# Patient Record
Sex: Female | Born: 1990
Health system: Southern US, Community
[De-identification: ages and names within clinical notes are randomized; demographics above are authoritative.]

## PROBLEM LIST (undated history)

## (undated) DIAGNOSIS — D649 Anemia, unspecified: Secondary | ICD-10-CM

## (undated) DIAGNOSIS — F419 Anxiety disorder, unspecified: Secondary | ICD-10-CM

## (undated) DIAGNOSIS — F32A Depression, unspecified: Secondary | ICD-10-CM

## (undated) DIAGNOSIS — Z8041 Family history of malignant neoplasm of ovary: Secondary | ICD-10-CM

## (undated) DIAGNOSIS — Z803 Family history of malignant neoplasm of breast: Secondary | ICD-10-CM

## (undated) HISTORY — DX: Family history of malignant neoplasm of breast: Z80.3

## (undated) HISTORY — DX: Family history of malignant neoplasm of ovary: Z80.41

---

## 2014-05-11 ENCOUNTER — Emergency Department (HOSPITAL_COMMUNITY)
Admission: EM | Admit: 2014-05-11 | Discharge: 2014-05-11 | Disposition: A | Discharge status: Home / Self Care | Payer: Private Health Insurance - Indemnity | Attending: Emergency Medicine | Admitting: Emergency Medicine

## 2014-05-11 ENCOUNTER — Encounter (HOSPITAL_COMMUNITY): Payer: Self-pay | Admitting: Emergency Medicine

## 2014-05-11 DIAGNOSIS — S0510XA Contusion of eyeball and orbital tissues, unspecified eye, initial encounter: Secondary | ICD-10-CM | POA: Insufficient documentation

## 2014-05-11 DIAGNOSIS — IMO0002 Reserved for concepts with insufficient information to code with codable children: Secondary | ICD-10-CM

## 2014-05-11 DIAGNOSIS — F3289 Other specified depressive episodes: Secondary | ICD-10-CM | POA: Insufficient documentation

## 2014-05-11 DIAGNOSIS — F329 Major depressive disorder, single episode, unspecified: Secondary | ICD-10-CM | POA: Insufficient documentation

## 2014-05-11 DIAGNOSIS — T07XXXA Unspecified multiple injuries, initial encounter: Secondary | ICD-10-CM

## 2014-05-11 DIAGNOSIS — S40019A Contusion of unspecified shoulder, initial encounter: Secondary | ICD-10-CM | POA: Insufficient documentation

## 2014-05-11 DIAGNOSIS — S0012XA Contusion of left eyelid and periocular area, initial encounter: Secondary | ICD-10-CM

## 2014-05-11 DIAGNOSIS — T71163A Asphyxiation due to hanging, assault, initial encounter: Secondary | ICD-10-CM | POA: Insufficient documentation

## 2014-05-11 MED ORDER — TRAMADOL HCL 50 MG PO TABS
50.0000 mg | ORAL_TABLET | Freq: Four times a day (QID) | ORAL | Status: DC | PRN
Start: 2014-05-11 — End: 2018-01-27

## 2014-05-11 MED ORDER — IBUPROFEN 800 MG PO TABS
800.0000 mg | ORAL_TABLET | Freq: Once | ORAL | Status: AC
  Administered 2014-05-11: 800 mg via ORAL
  Filled 2014-05-11: qty 1

## 2014-05-11 NOTE — Discharge Instructions (Signed)
For pain control you may take:  800mg of ibuprofen (that is usually 4 over the counter pills)  3 times a day (take with food) and acetaminophen 975mg (this is 3 over the counter pills) four times a day. Do not drink alcohol or combine with other medications that have acetaminophen as an ingredient (Read the labels!).  For breakthrough pain you may take Tramadol. Do not drink alcohol drive or operate heavy machinery when taking Tramadol. ° °Do not hesitate to return to the Emergency Department for any new, worsening or concerning symptoms.  ° °If you do not have a primary care doctor you can establish one at the  ° °[facility]: °[address] °[place] [address] °[phone] ° °After you establish care. Let them know you were seen in the emergency room. They must obtain records for further management.  ° ° °

## 2014-05-11 NOTE — ED Provider Notes (Signed)
Medical screening examination/treatment/procedure(s) were performed by non-physician practitioner and as supervising physician I was immediately available for consultation/collaboration.   EKG Interpretation None      Devoria AlbeIva Knapp, MD, Armando GangFACEP   Ward GivensIva L Knapp, MD 05/11/14 1524
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
## 2015-02-18 ENCOUNTER — Encounter: Admit: 2015-02-18 | Payer: Self-pay | Admitting: Obstetrics and Gynecology

## 2015-02-25 ENCOUNTER — Encounter
Admit: 2015-02-25 | Disposition: A | Discharge status: Home / Self Care | Payer: Self-pay | Attending: Maternal & Fetal Medicine | Admitting: Maternal & Fetal Medicine

## 2015-03-01 ENCOUNTER — Other Ambulatory Visit: Payer: Self-pay | Admitting: Maternal & Fetal Medicine

## 2015-03-01 DIAGNOSIS — Z148 Genetic carrier of other disease: Secondary | ICD-10-CM

## 2015-03-25 ENCOUNTER — Ambulatory Visit
Admission: RE | Admit: 2015-03-25 | Discharge: 2015-03-25 | Disposition: A | Discharge status: Home / Self Care | Payer: 59 | Source: Ambulatory Visit | Attending: Obstetrics & Gynecology | Admitting: Obstetrics & Gynecology

## 2015-03-25 VITALS — BP 102/56 | HR 74 | Temp 98.6°F | Ht 62.0 in | Wt 142.0 lb

## 2015-03-25 DIAGNOSIS — Z148 Genetic carrier of other disease: Secondary | ICD-10-CM | POA: Diagnosis present

## 2015-03-25 DIAGNOSIS — Z3A18 18 weeks gestation of pregnancy: Secondary | ICD-10-CM | POA: Insufficient documentation

## 2015-03-25 DIAGNOSIS — Z8481 Family history of carrier of genetic disease: Secondary | ICD-10-CM | POA: Insufficient documentation

## 2015-03-25 DIAGNOSIS — O26892 Other specified pregnancy related conditions, second trimester: Secondary | ICD-10-CM | POA: Diagnosis not present

## 2015-03-25 LAB — US OB DETAIL + 14 WK

## 2015-06-03 ENCOUNTER — Inpatient Hospital Stay: Admission: RE | Admit: 2015-06-03 | Payer: 59 | Source: Ambulatory Visit

## 2015-06-21 ENCOUNTER — Other Ambulatory Visit: Payer: Self-pay | Admitting: Nurse Practitioner

## 2015-08-15 LAB — OB RESULTS CONSOLE GBS: GBS: NEGATIVE

## 2015-08-28 ENCOUNTER — Inpatient Hospital Stay
Admission: EM | Admit: 2015-08-28 | Discharge: 2015-09-01 | DRG: 765 | Disposition: A | Discharge status: Home / Self Care | Payer: 59 | Attending: Obstetrics & Gynecology | Admitting: Obstetrics & Gynecology

## 2015-08-28 DIAGNOSIS — Z3490 Encounter for supervision of normal pregnancy, unspecified, unspecified trimester: Secondary | ICD-10-CM

## 2015-08-28 DIAGNOSIS — O480 Post-term pregnancy: Secondary | ICD-10-CM | POA: Diagnosis present

## 2015-08-28 DIAGNOSIS — O339 Maternal care for disproportion, unspecified: Principal | ICD-10-CM | POA: Diagnosis present

## 2015-08-28 DIAGNOSIS — Z8481 Family history of carrier of genetic disease: Secondary | ICD-10-CM

## 2015-08-28 DIAGNOSIS — O9081 Anemia of the puerperium: Secondary | ICD-10-CM

## 2015-08-28 DIAGNOSIS — Z3A41 41 weeks gestation of pregnancy: Secondary | ICD-10-CM

## 2015-08-28 DIAGNOSIS — Z98891 History of uterine scar from previous surgery: Secondary | ICD-10-CM

## 2015-08-28 DIAGNOSIS — D62 Acute posthemorrhagic anemia: Secondary | ICD-10-CM | POA: Diagnosis present

## 2015-08-28 HISTORY — DX: Anemia, unspecified: D64.9

## 2015-08-28 LAB — CBC
HEMATOCRIT: 27.7 % — AB (ref 35.0–47.0)
Hemoglobin: 8.9 g/dL — ABNORMAL LOW (ref 12.0–16.0)
MCH: 25.3 pg — AB (ref 26.0–34.0)
MCHC: 32.2 g/dL (ref 32.0–36.0)
MCV: 78.6 fL — ABNORMAL LOW (ref 80.0–100.0)
Platelets: 195 10*3/uL (ref 150–440)
RBC: 3.53 MIL/uL — ABNORMAL LOW (ref 3.80–5.20)
RDW: 15.9 % — AB (ref 11.5–14.5)
WBC: 9.8 10*3/uL (ref 3.6–11.0)

## 2015-08-28 MED ORDER — LACTATED RINGERS IV SOLN
500.0000 mL | INTRAVENOUS | Status: DC | PRN
  Administered 2015-08-29: 250 mL via INTRAVENOUS

## 2015-08-28 MED ORDER — OXYCODONE-ACETAMINOPHEN 5-325 MG PO TABS: 2.0000 | ORAL_TABLET | ORAL | Status: DC | PRN

## 2015-08-28 MED ORDER — OXYCODONE-ACETAMINOPHEN 5-325 MG PO TABS: 1.0000 | ORAL_TABLET | ORAL | Status: DC | PRN

## 2015-08-28 MED ORDER — ZOLPIDEM TARTRATE 5 MG PO TABS
5.0000 mg | ORAL_TABLET | Freq: Every evening | ORAL | Status: DC | PRN
  Administered 2015-08-29: 5 mg via ORAL
  Filled 2015-08-28: qty 1

## 2015-08-28 MED ORDER — ACETAMINOPHEN 325 MG PO TABS: 650.0000 mg | ORAL_TABLET | ORAL | Status: DC | PRN

## 2015-08-28 MED ORDER — OXYTOCIN 40 UNITS IN LACTATED RINGERS INFUSION - SIMPLE MED
62.5000 mL/h | INTRAVENOUS | Status: DC
Start: 2015-08-28 — End: 2015-08-30
  Filled 2015-08-28: qty 1000

## 2015-08-28 MED ORDER — OXYTOCIN BOLUS FROM INFUSION: 500.0000 mL | INTRAVENOUS | Status: DC

## 2015-08-28 MED ORDER — LACTATED RINGERS IV SOLN
INTRAVENOUS | Status: DC
  Administered 2015-08-29 – 2015-08-30 (×3): via INTRAVENOUS

## 2015-08-28 MED ORDER — ONDANSETRON HCL 4 MG/2ML IJ SOLN
4.0000 mg | Freq: Four times a day (QID) | INTRAMUSCULAR | Status: DC | PRN
  Administered 2015-08-29 – 2015-08-30 (×2): 4 mg via INTRAVENOUS
  Filled 2015-08-28: qty 2

## 2015-08-28 MED ORDER — DINOPROSTONE 10 MG VA INST
10.0000 mg | VAGINAL_INSERT | Freq: Once | VAGINAL | Status: AC
  Administered 2015-08-28: 10 mg via VAGINAL
  Filled 2015-08-28: qty 1

## 2015-08-28 MED ORDER — CITRIC ACID-SODIUM CITRATE 334-500 MG/5ML PO SOLN: 30.0000 mL | ORAL | Status: DC | PRN

## 2015-08-28 MED ORDER — LIDOCAINE HCL (PF) 1 % IJ SOLN
30.0000 mL | INTRAMUSCULAR | Status: AC | PRN
  Administered 2015-08-29: 3 mL via SUBCUTANEOUS

## 2015-08-28 NOTE — H&P (Signed)
Date of Initial paper H&P: 08/26/15  History reviewed, patient examined, no change in status, stable for elective IOL at 5541w3d.

## 2015-08-29 ENCOUNTER — Inpatient Hospital Stay: Payer: 59 | Admitting: Anesthesiology

## 2015-08-29 LAB — OB RESULTS CONSOLE GC/CHLAMYDIA
Chlamydia: NEGATIVE
Gonorrhea: NEGATIVE

## 2015-08-29 LAB — CHLAMYDIA/NGC RT PCR (ARMC ONLY)
CHLAMYDIA TR: NOT DETECTED
N GONORRHOEAE: NOT DETECTED

## 2015-08-29 LAB — ABO/RH: ABO/RH(D): A NEG

## 2015-08-29 MED ORDER — FENTANYL 2.5 MCG/ML W/ROPIVACAINE 0.2% IN NS 100 ML EPIDURAL INFUSION (ARMC-ANES)
9.0000 mL/h | EPIDURAL | Status: DC
  Administered 2015-08-29 – 2015-08-30 (×2): 9 mL/h via EPIDURAL
  Filled 2015-08-29: qty 100

## 2015-08-29 MED ORDER — BUPIVACAINE HCL (PF) 0.25 % IJ SOLN
INTRAMUSCULAR | Status: DC | PRN
  Administered 2015-08-29: 5 mL via EPIDURAL
  Administered 2015-08-29: 3 mL via EPIDURAL

## 2015-08-29 MED ORDER — BUTORPHANOL TARTRATE 1 MG/ML IJ SOLN
INTRAMUSCULAR | Status: AC
  Administered 2015-08-29: 1 mg via INTRAVENOUS
  Filled 2015-08-29: qty 1

## 2015-08-29 MED ORDER — EPHEDRINE 5 MG/ML INJ: 10.0000 mg | INTRAVENOUS | Status: DC | PRN

## 2015-08-29 MED ORDER — TERBUTALINE SULFATE 1 MG/ML IJ SOLN: 0.2500 mg | Freq: Once | INTRAMUSCULAR | Status: DC | PRN

## 2015-08-29 MED ORDER — FENTANYL 2.5 MCG/ML W/ROPIVACAINE 0.2% IN NS 100 ML EPIDURAL INFUSION (ARMC-ANES)
EPIDURAL | Status: AC
Start: 2015-08-29 — End: 2015-08-30
  Administered 2015-08-29 – 2015-08-30 (×2): 9 mL/h via EPIDURAL
  Filled 2015-08-29: qty 100

## 2015-08-29 MED ORDER — SODIUM CHLORIDE 0.9 % IJ SOLN
INTRAMUSCULAR | Status: AC
  Administered 2015-08-29: 3 mL
  Filled 2015-08-29: qty 3

## 2015-08-29 MED ORDER — DIPHENHYDRAMINE HCL 50 MG/ML IJ SOLN: 12.5000 mg | INTRAMUSCULAR | Status: DC | PRN

## 2015-08-29 MED ORDER — OXYTOCIN 40 UNITS IN LACTATED RINGERS INFUSION - SIMPLE MED
1.0000 m[IU]/min | INTRAVENOUS | Status: DC
  Administered 2015-08-29: 4 m[IU]/min via INTRAVENOUS
  Administered 2015-08-29: 1 m[IU]/min via INTRAVENOUS

## 2015-08-29 MED ORDER — BUTORPHANOL TARTRATE 1 MG/ML IJ SOLN
1.0000 mg | INTRAMUSCULAR | Status: DC | PRN
  Administered 2015-08-29 (×3): 1 mg via INTRAVENOUS
  Filled 2015-08-29 (×2): qty 1

## 2015-08-29 MED ORDER — PHENYLEPHRINE 40 MCG/ML (10ML) SYRINGE FOR IV PUSH (FOR BLOOD PRESSURE SUPPORT): 80.0000 ug | PREFILLED_SYRINGE | INTRAVENOUS | Status: DC | PRN

## 2015-08-29 MED ORDER — LIDOCAINE-EPINEPHRINE (PF) 1.5 %-1:200000 IJ SOLN
INTRAMUSCULAR | Status: DC | PRN
  Administered 2015-08-29: 3 mL via PERINEURAL

## 2015-08-29 NOTE — Progress Notes (Signed)
  Labor Progress Note   24 y.o. G1P0 @ 2238w6d , admitted for  Pregnancy, Labor Management. IOL POST DATES.  Subjective:  Pain improved w epidural, on Pitocin 7 mU/min.   Objective:  BP 95/61 mmHg  Pulse 69  Temp(Src) 98.3 F (36.8 C) (Oral)  Resp 18  Ht 5\' 2"  (1.575 m)  Wt 170 lb (77.111 kg)  BMI 31.09 kg/m2  SpO2 100%  LMP 11/16/2014 (Exact Date) Abd: Vtx, tender w Ctxs Extr: trace to 1+ bilateral pedal edema SVE: CERVIX: 5 cm dilated, 80 effaced, -2 station, presenting part Vtx MEMBRANES: AROM clear  EFM: FHR: 150 bpm, variability: moderate,  accelerations:  Present,  decelerations:  Absent Toco: Frequency: Every 3-4 minutes Labs: I have reviewed the patient's lab results.   Assessment & Plan:  G1P0 @ 2738w6d, admitted for  Pregnancy and Labor/Delivery Management  1. Pain management: none. Epidural 2. FWB: FHT category 1.  3. ID: GBS negative 4. Labor management: Pitocin.  AROM. IUPC  All discussed with patient, see orders

## 2015-08-29 NOTE — Anesthesia Preprocedure Evaluation (Signed)
Anesthesia Evaluation  Patient identified by MRN, date of birth, ID band  Reviewed: Allergy & Precautions, H&P , Patient's Chart, lab work & pertinent test results  History of Anesthesia Complications Negative for: history of anesthetic complications  Airway Mallampati: II  TM Distance: <3 FB Neck ROM: full    Dental no notable dental hx. (+) Teeth Intact   Pulmonary neg pulmonary ROS,    Pulmonary exam normal        Cardiovascular negative cardio ROS Normal cardiovascular exam     Neuro/Psych negative neurological ROS  negative psych ROS   GI/Hepatic negative GI ROS, Neg liver ROS,   Endo/Other  negative endocrine ROS  Renal/GU negative Renal ROS  negative genitourinary   Musculoskeletal   Abdominal   Peds  Hematology negative hematology ROS (+)   Anesthesia Other Findings   Reproductive/Obstetrics (+) Pregnancy                             Anesthesia Physical Anesthesia Plan  ASA: II  Anesthesia Plan: Epidural   Post-op Pain Management:    Induction:   Airway Management Planned:   Additional Equipment:   Intra-op Plan:   Post-operative Plan:   Informed Consent: I have reviewed the patients History and Physical, chart, labs and discussed the procedure including the risks, benefits and alternatives for the proposed anesthesia with the patient or authorized representative who has indicated his/her understanding and acceptance.     Plan Discussed with: CRNA and Anesthesiologist  Anesthesia Plan Comments:         Anesthesia Quick Evaluation

## 2015-08-29 NOTE — Progress Notes (Signed)
  Labor Progress Note   [age] y.o. G1P0 @ 1965w6d , admitted for  Pregnancy, Labor Management. IOL POST DATES.  Subjective:  Pain mild and difficulty sleeping last night w Cervadil  Objective:  BP 122/75 mmHg  Pulse 103  Temp(Src) 98.6 F (37 C) (Oral)  Resp 18  Ht 5\' 2"  (1.575 m)  Wt 170 lb (77.111 kg)  BMI 31.09 kg/m2  LMP 11/16/2014 (Exact Date) Abd: Vtx, T 2 Ctxs Extr: trace to 1+ bilateral pedal edema SVE: CERVIX: 1 cm dilated, 80 effaced, -3 station, presenting part Vtx MEMBRANES: intact  EFM: FHR: 150 bpm, variability: moderate,  accelerations:  Present,  decelerations:  Absent Toco: Frequency: Every 3-4 minutes Labs: I have reviewed the patient's lab results.   Assessment & Plan:  G1P0 @ 4065w6d, admitted for  Pregnancy and Labor/Delivery Management  1. Pain management: none. 2. FWB: FHT category 1.  3. ID: GBS negative 4. Labor management: Pitocin next.  AROM when able.  Epidural when changes cervix.  All discussed with patient, see orders

## 2015-08-29 NOTE — Progress Notes (Signed)
  Labor Progress Note   24 y.o. G1P0 @ 2557w6d , admitted for  Pregnancy, Labor Management. IOL POST DATES.  Subjective:  Pain tolerated w epidural, on Pitocin 20 mU/min.   Objective:  BP 105/53 mmHg  Pulse 79  Temp(Src) 98.3 F (36.8 C) (Oral)  Resp 16  Ht 5\' 2"  (1.575 m)  Wt 170 lb (77.111 kg)  BMI 31.09 kg/m2  SpO2 100%  LMP 11/16/2014 (Exact Date) Abd: Vtx, tender w Ctxs Extr: trace to 1+ bilateral pedal edema SVE: CERVIX: 7 cm dilated, 80 effaced, -2 station, presenting part Vtx w caput MEMBRANES: ruptured  EFM: FHR: 150 bpm, variability: moderate,  accelerations:  Present,  decelerations:  Absent Toco: Frequency: Every 2-3 minutes, >180 mVu Labs: I have reviewed the patient's lab results.   Assessment & Plan:  G1P0 @ 6657w6d, admitted for  Pregnancy and Labor/Delivery Management  1. Pain management:  Epidural 2. FWB: FHT category 1.  3. ID: GBS negative 4. Labor management: Pitocin monitored w IUPC Protracted labor risks d/w pt (pt was 5 cm for several hours before progressing to 7).  Cont to monitor.  CS discussed.  All discussed with patient, see orders

## 2015-08-29 NOTE — Progress Notes (Signed)
  Labor Progress Note   24 y.o. G1P0 @ 6817w6d , admitted for  Pregnancy, Labor Management. IOL POST DATES.  Subjective:  Pain mod, on Pitocin 7 mU/min.  Leaking fluid but neg for amnioitic fluid  Objective:  BP 111/57 mmHg  Pulse 86  Temp(Src) 98.3 F (36.8 C) (Oral)  Resp 18  Ht 5\' 2"  (1.575 m)  Wt 170 lb (77.111 kg)  BMI 31.09 kg/m2  LMP 11/16/2014 (Exact Date) Abd: Vtx, tender w Ctxs Extr: trace to 1+ bilateral pedal edema SVE: CERVIX: 3 cm dilated, 80 effaced, -2 station, presenting part Vtx MEMBRANES: intact  EFM: FHR: 150 bpm, variability: moderate,  accelerations:  Present,  decelerations:  Absent Toco: Frequency: Every 3-4 minutes Labs: I have reviewed the patient's lab results.   Assessment & Plan:  G1P0 @ 1017w6d, admitted for  Pregnancy and Labor/Delivery Management  1. Pain management: none. Epidural now. 2. FWB: FHT category 1.  3. ID: GBS negative 4. Labor management: Pitocin.  AROM when able.  Epidural when changes cervix.  All discussed with patient, see orders

## 2015-08-29 NOTE — Progress Notes (Signed)
Pt has been sleeping.  States now she is starting to feel some of the contractions.  No real pain at this time.  EFM sketchy at times d/t audible fetal movement.

## 2015-08-29 NOTE — Anesthesia Procedure Notes (Signed)
Epidural Patient location during procedure: OB Start time: 08/29/2015 3:30 PM  Staffing Resident/CRNA: Greogory Cornette Performed by: resident/CRNA   Preanesthetic Checklist Completed: patient identified, site marked, surgical consent, pre-op evaluation, timeout performed, IV checked, risks and benefits discussed and monitors and equipment checked  Epidural Patient position: sitting Prep: Betadine Patient monitoring: heart rate, continuous pulse ox and blood pressure Approach: midline Location: L4-L5 Injection technique: LOR saline  Needle:  Needle type: Tuohy  Needle gauge: 18 G Needle length: 9 cm and 9 Catheter type: closed end flexible Catheter size: 20 Guage Test dose: negative and 1.5% lidocaine with Epi 1:200 K  Assessment Events: blood not aspirated, injection not painful, no injection resistance, negative IV test and no paresthesia  Additional Notes   Patient tolerated the insertion well without complications.Reason for block:procedure for pain

## 2015-08-30 ENCOUNTER — Other Ambulatory Visit: Payer: Self-pay | Admitting: Obstetrics and Gynecology

## 2015-08-30 ENCOUNTER — Inpatient Hospital Stay: Payer: 59 | Admitting: Anesthesiology

## 2015-08-30 ENCOUNTER — Encounter
Admission: EM | Disposition: A | Discharge status: Home / Self Care | Payer: Self-pay | Source: Home / Self Care | Attending: Obstetrics & Gynecology

## 2015-08-30 DIAGNOSIS — Z98891 History of uterine scar from previous surgery: Secondary | ICD-10-CM

## 2015-08-30 LAB — CBC
HCT: 24.4 % — ABNORMAL LOW (ref 35.0–47.0)
Hemoglobin: 7.8 g/dL — ABNORMAL LOW (ref 12.0–16.0)
MCH: 25.4 pg — ABNORMAL LOW (ref 26.0–34.0)
MCHC: 31.9 g/dL — ABNORMAL LOW (ref 32.0–36.0)
MCV: 79.5 fL — ABNORMAL LOW (ref 80.0–100.0)
Platelets: 154 K/uL (ref 150–440)
RBC: 3.07 MIL/uL — ABNORMAL LOW (ref 3.80–5.20)
RDW: 15.8 % — ABNORMAL HIGH (ref 11.5–14.5)
WBC: 15.8 K/uL — ABNORMAL HIGH (ref 3.6–11.0)

## 2015-08-30 LAB — RPR: RPR Ser Ql: NONREACTIVE

## 2015-08-30 SURGERY — Surgical Case
Anesthesia: Spinal | Site: Abdomen | Wound class: Clean Contaminated

## 2015-08-30 MED ORDER — MORPHINE SULFATE (PF) 2 MG/ML IV SOLN
1.0000 mg | INTRAVENOUS | Status: DC | PRN
Start: 2015-08-30 — End: 2015-08-31

## 2015-08-30 MED ORDER — CITRIC ACID-SODIUM CITRATE 334-500 MG/5ML PO SOLN
30.0000 mL | ORAL | Status: AC
  Administered 2015-08-30: 30 mL via ORAL

## 2015-08-30 MED ORDER — OXYTOCIN 40 UNITS IN LACTATED RINGERS INFUSION - SIMPLE MED
62.5000 mL/h | INTRAVENOUS | Status: DC
  Administered 2015-08-30: 62.5 mL/h via INTRAVENOUS
  Filled 2015-08-30: qty 1000

## 2015-08-30 MED ORDER — KETOROLAC TROMETHAMINE 30 MG/ML IJ SOLN
30.0000 mg | Freq: Four times a day (QID) | INTRAMUSCULAR | Status: AC
  Administered 2015-08-30 (×4): 30 mg via INTRAVENOUS
  Filled 2015-08-30 (×4): qty 1

## 2015-08-30 MED ORDER — NALBUPHINE HCL 10 MG/ML IJ SOLN
5.0000 mg | INTRAMUSCULAR | Status: DC | PRN
  Filled 2015-08-30: qty 0.5

## 2015-08-30 MED ORDER — WITCH HAZEL-GLYCERIN EX PADS: 1.0000 "application " | MEDICATED_PAD | CUTANEOUS | Status: DC | PRN

## 2015-08-30 MED ORDER — FERROUS GLUCONATE 324 (38 FE) MG PO TABS
324.0000 mg | ORAL_TABLET | Freq: Every day | ORAL | Status: DC
  Administered 2015-08-30 – 2015-08-31 (×2): 324 mg via ORAL
  Filled 2015-08-30 (×2): qty 1

## 2015-08-30 MED ORDER — NALOXONE HCL 1 MG/ML IJ SOLN
1.0000 ug/kg/h | INTRAMUSCULAR | Status: DC | PRN
  Filled 2015-08-30: qty 2

## 2015-08-30 MED ORDER — DIPHENHYDRAMINE HCL 50 MG/ML IJ SOLN: 12.5000 mg | INTRAMUSCULAR | Status: DC | PRN

## 2015-08-30 MED ORDER — SIMETHICONE 80 MG PO CHEW
80.0000 mg | CHEWABLE_TABLET | Freq: Three times a day (TID) | ORAL | Status: DC
  Administered 2015-08-30: 80 mg via ORAL
  Filled 2015-08-30: qty 1

## 2015-08-30 MED ORDER — NALBUPHINE HCL 10 MG/ML IJ SOLN
5.0000 mg | Freq: Once | INTRAMUSCULAR | Status: DC | PRN
  Filled 2015-08-30: qty 0.5

## 2015-08-30 MED ORDER — BUPIVACAINE IN DEXTROSE 0.75-8.25 % IT SOLN
INTRATHECAL | Status: DC | PRN
  Administered 2015-08-30: 1.7 mL via INTRATHECAL

## 2015-08-30 MED ORDER — BUPIVACAINE HCL (PF) 0.5 % IJ SOLN
INTRAMUSCULAR | Status: AC
  Filled 2015-08-30: qty 30

## 2015-08-30 MED ORDER — OXYCODONE-ACETAMINOPHEN 5-325 MG PO TABS
1.0000 | ORAL_TABLET | ORAL | Status: DC | PRN
  Administered 2015-08-30 – 2015-08-31 (×5): 1 via ORAL
  Filled 2015-08-30 (×5): qty 1

## 2015-08-30 MED ORDER — NALOXONE HCL 0.4 MG/ML IJ SOLN: 0.4000 mg | INTRAMUSCULAR | Status: DC | PRN

## 2015-08-30 MED ORDER — OXYCODONE-ACETAMINOPHEN 5-325 MG PO TABS
2.0000 | ORAL_TABLET | ORAL | Status: DC | PRN
  Administered 2015-09-01: 2 via ORAL
  Filled 2015-08-30: qty 2

## 2015-08-30 MED ORDER — SODIUM CHLORIDE 0.9 % IJ SOLN: 3.0000 mL | INTRAMUSCULAR | Status: DC | PRN

## 2015-08-30 MED ORDER — LACTATED RINGERS IV SOLN: INTRAVENOUS | Status: DC

## 2015-08-30 MED ORDER — CEFOXITIN SODIUM-DEXTROSE 2-2.2 GM-% IV SOLR (PREMIX)
INTRAVENOUS | Status: AC
  Filled 2015-08-30: qty 50

## 2015-08-30 MED ORDER — BUPIVACAINE HCL (PF) 0.5 % IJ SOLN
INTRAMUSCULAR | Status: DC | PRN
  Administered 2015-08-30: 10 mL

## 2015-08-30 MED ORDER — MENTHOL 3 MG MT LOZG: 1.0000 | LOZENGE | OROMUCOSAL | Status: DC | PRN

## 2015-08-30 MED ORDER — OXYTOCIN 40 UNITS IN LACTATED RINGERS INFUSION - SIMPLE MED
INTRAVENOUS | Status: DC | PRN
  Administered 2015-08-30: 1000 mL via INTRAVENOUS

## 2015-08-30 MED ORDER — SENNOSIDES-DOCUSATE SODIUM 8.6-50 MG PO TABS
2.0000 | ORAL_TABLET | ORAL | Status: DC
  Administered 2015-08-31 – 2015-09-01 (×2): 2 via ORAL
  Filled 2015-08-30 (×2): qty 2

## 2015-08-30 MED ORDER — ONDANSETRON HCL 4 MG/2ML IJ SOLN: 4.0000 mg | Freq: Once | INTRAMUSCULAR | Status: DC | PRN

## 2015-08-30 MED ORDER — BUPIVACAINE 0.25 % ON-Q PUMP DUAL CATH 400 ML: 400.0000 mL | INJECTION | Status: DC

## 2015-08-30 MED ORDER — PHENYLEPHRINE HCL 10 MG/ML IJ SOLN: 10.0000 mg | INTRAMUSCULAR | Status: DC | PRN

## 2015-08-30 MED ORDER — DIPHENHYDRAMINE HCL 25 MG PO CAPS: 25.0000 mg | ORAL_CAPSULE | ORAL | Status: DC | PRN

## 2015-08-30 MED ORDER — DIPHENHYDRAMINE HCL 25 MG PO CAPS: 25.0000 mg | ORAL_CAPSULE | Freq: Four times a day (QID) | ORAL | Status: DC | PRN

## 2015-08-30 MED ORDER — BUPIVACAINE HCL 0.5 % IJ SOLN: 10.0000 mL | Freq: Once | INTRAMUSCULAR | Status: DC

## 2015-08-30 MED ORDER — SIMETHICONE 80 MG PO CHEW
80.0000 mg | CHEWABLE_TABLET | ORAL | Status: DC | PRN
  Administered 2015-09-01: 80 mg via ORAL
  Filled 2015-08-30: qty 1

## 2015-08-30 MED ORDER — IBUPROFEN 600 MG PO TABS: 600.0000 mg | ORAL_TABLET | Freq: Four times a day (QID) | ORAL | Status: DC | PRN

## 2015-08-30 MED ORDER — SIMETHICONE 80 MG PO CHEW: 80.0000 mg | CHEWABLE_TABLET | ORAL | Status: DC

## 2015-08-30 MED ORDER — CEFOXITIN SODIUM-DEXTROSE 2-2.2 GM-% IV SOLR (PREMIX): 2.0000 g | INTRAVENOUS | Status: DC

## 2015-08-30 MED ORDER — FENTANYL CITRATE (PF) 100 MCG/2ML IJ SOLN
25.0000 ug | INTRAMUSCULAR | Status: DC | PRN
  Administered 2015-08-30: 25 ug via INTRAVENOUS
  Filled 2015-08-30: qty 2

## 2015-08-30 MED ORDER — PRENATAL MULTIVITAMIN CH
1.0000 | ORAL_TABLET | Freq: Every day | ORAL | Status: DC
  Administered 2015-08-30 – 2015-08-31 (×2): 1 via ORAL
  Filled 2015-08-30 (×2): qty 1

## 2015-08-30 MED ORDER — MEPERIDINE HCL 25 MG/ML IJ SOLN: 6.2500 mg | INTRAMUSCULAR | Status: DC | PRN

## 2015-08-30 MED ORDER — LANOLIN HYDROUS EX OINT: 1.0000 "application " | TOPICAL_OINTMENT | CUTANEOUS | Status: DC | PRN

## 2015-08-30 MED ORDER — PHENYLEPHRINE HCL 10 MG/ML IJ SOLN
INTRAMUSCULAR | Status: DC | PRN
  Administered 2015-08-30: 100 ug via INTRAVENOUS
  Administered 2015-08-30 (×2): 150 ug via INTRAVENOUS
  Administered 2015-08-30: 200 ug via INTRAVENOUS
  Administered 2015-08-30 (×4): 100 ug via INTRAVENOUS

## 2015-08-30 MED ORDER — SCOPOLAMINE 1 MG/3DAYS TD PT72: 1.0000 | MEDICATED_PATCH | Freq: Once | TRANSDERMAL | Status: DC

## 2015-08-30 MED ORDER — ZOLPIDEM TARTRATE 5 MG PO TABS: 5.0000 mg | ORAL_TABLET | Freq: Every evening | ORAL | Status: DC | PRN

## 2015-08-30 MED ORDER — LACTATED RINGERS IV BOLUS (SEPSIS)
300.0000 mL | Freq: Once | INTRAVENOUS | Status: AC
  Administered 2015-08-30: 300 mL via INTRAVENOUS

## 2015-08-30 MED ORDER — BUPIVACAINE 0.25 % ON-Q PUMP DUAL CATH 400 ML
INJECTION | Status: AC
  Filled 2015-08-30: qty 400

## 2015-08-30 MED ORDER — CITRIC ACID-SODIUM CITRATE 334-500 MG/5ML PO SOLN
ORAL | Status: AC
  Administered 2015-08-30: 30 mL via ORAL
  Filled 2015-08-30: qty 15

## 2015-08-30 MED ORDER — MORPHINE SULFATE (PF) 0.5 MG/ML IJ SOLN
INTRAMUSCULAR | Status: DC | PRN
  Administered 2015-08-30: .2 mg via EPIDURAL

## 2015-08-30 MED ORDER — ONDANSETRON HCL 4 MG/2ML IJ SOLN: 4.0000 mg | Freq: Three times a day (TID) | INTRAMUSCULAR | Status: DC | PRN

## 2015-08-30 MED ORDER — DIBUCAINE 1 % RE OINT: 1.0000 "application " | TOPICAL_OINTMENT | RECTAL | Status: DC | PRN

## 2015-08-30 MED ORDER — DOCUSATE SODIUM 100 MG PO CAPS
100.0000 mg | ORAL_CAPSULE | Freq: Every day | ORAL | Status: DC
  Administered 2015-08-30 – 2015-09-01 (×3): 100 mg via ORAL
  Filled 2015-08-30 (×3): qty 1

## 2015-08-30 MED ORDER — TETANUS-DIPHTH-ACELL PERTUSSIS 5-2.5-18.5 LF-MCG/0.5 IM SUSP: 0.5000 mL | Freq: Once | INTRAMUSCULAR | Status: DC

## 2015-08-30 MED ORDER — ACETAMINOPHEN 325 MG PO TABS: 650.0000 mg | ORAL_TABLET | ORAL | Status: DC | PRN

## 2015-08-30 SURGICAL SUPPLY — 21 items
CANISTER SUCT 3000ML (MISCELLANEOUS) ×2 IMPLANT
CATH KIT ON-Q SILVERSOAK 5IN (CATHETERS) ×4 IMPLANT
CHLORAPREP W/TINT 26ML (MISCELLANEOUS) ×4 IMPLANT
ELECT CAUTERY BLADE 6.4 (BLADE) ×2 IMPLANT
GLOVE SKINSENSE NS SZ8.0 LF (GLOVE) ×1
GLOVE SKINSENSE STRL SZ8.0 LF (GLOVE) ×1 IMPLANT
GOWN STRL REUS W/ TWL LRG LVL3 (GOWN DISPOSABLE) ×1 IMPLANT
GOWN STRL REUS W/ TWL XL LVL3 (GOWN DISPOSABLE) ×2 IMPLANT
GOWN STRL REUS W/TWL LRG LVL3 (GOWN DISPOSABLE) ×1
GOWN STRL REUS W/TWL XL LVL3 (GOWN DISPOSABLE) ×2
LIQUID BAND (GAUZE/BANDAGES/DRESSINGS) ×2 IMPLANT
NS IRRIG 1000ML POUR BTL (IV SOLUTION) ×2 IMPLANT
PACK C SECTION AR (MISCELLANEOUS) ×2 IMPLANT
PAD GROUND ADULT SPLIT (MISCELLANEOUS) ×2 IMPLANT
PAD OB MATERNITY 4.3X12.25 (PERSONAL CARE ITEMS) ×2 IMPLANT
PAD PREP 24X41 OB/GYN DISP (PERSONAL CARE ITEMS) ×2 IMPLANT
SPONGE LAP 18X18 5 PK (GAUZE/BANDAGES/DRESSINGS) ×4 IMPLANT
SUT MAXON ABS #0 GS21 30IN (SUTURE) ×4 IMPLANT
SUT VIC AB 1 CT1 36 (SUTURE) ×6 IMPLANT
SUT VIC AB 2-0 CT1 36 (SUTURE) ×2 IMPLANT
SUT VIC AB 4-0 FS2 27 (SUTURE) ×2 IMPLANT

## 2015-08-30 NOTE — Anesthesia Procedure Notes (Signed)
Spinal Patient location during procedure: OR Start time: 08/30/2015 2:14 AM End time: 08/30/2015 2:20 AM Reason for block: at surgeon's request Staffing Anesthesiologist: Elijio MilesVAN STAVEREN, GIJSBERTUS F Performed by: anesthesiologist  Preanesthetic Checklist Completed: patient identified, site marked, surgical consent, pre-op evaluation, timeout performed, IV checked, risks and benefits discussed, monitors and equipment checked and at surgeon's request Spinal Block Patient position: sitting Prep: Betadine Patient monitoring: heart rate and blood pressure Approach: midline Location: L3-4 Injection technique: single-shot Needle Needle type: Quincke  Needle gauge: 25 G Needle length: 9 cm Needle insertion depth: 5 cm Assessment Sensory level: T4

## 2015-08-30 NOTE — Op Note (Signed)
Cesarean Section Procedure Note Indications: failure to progress: arrest of dilation, prior cesarean section and term intrauterine pregnancy  Pre-operative Diagnosis: Intrauterine pregnancy 3343w0d ;  failure to progress: arrest of dilation, prior cesarean section and term intrauterine pregnancy Post-operative Diagnosis: same, delivered. Procedure: Low Transverse Cesarean Section Surgeon: Annamarie MajorPaul Angelo Caroll, MD, FACOG Anesthesia: Spinal anesthesia Estimated Blood Loss:700 mL Complications: None; patient tolerated the procedure well. Disposition: PACU - hemodynamically stable. Condition: stable  Findings: A female infant in the cephalic presentation. Amniotic fluid - Clear  Birth weight 9-3 lbs.  Apgars of 9 and 9.  Intact placenta with a three-vessel cord. Grossly normal uterus, tubes and ovaries bilaterally. No intraabdominal adhesions were noted.  Procedure Details   The patient was taken to Operating Room, identified as the correct patient and the procedure verified as C-Section Delivery. A Time Out was held and the above information confirmed. After induction of anesthesia, the patient was draped and prepped in the usual sterile manner. A Pfannenstiel incision was made and carried down through the subcutaneous tissue to the fascia. Fascial incision was made and extended transversely with the Mayo scissors. The fascia was separated from the underlying rectus tissue superiorly and inferiorly. The peritoneum was identified and entered bluntly. Peritoneal incision was extended longitudinally. The utero-vesical peritoneal reflection was incised transversely and a bladder flap was created digitally.  A low transverse hysterotomy was made. The fetus was delivered atraumatically. The umbilical cord was clamped x2 and cut and the infant was handed to the awaiting pediatricians. The placenta was removed intact and appeared normal with a 3-vessel cord.  The uterus was exteriorized and cleared of all clot  and debris. The hysterotomy was closed with running sutures of 0 Vicryl suture. A second imbricating layer was placed with the same suture. Excellent hemostasis was observed. The uterus was returned to the abdomen. The pelvis was irrigated and again, excellent hemostasis was noted.  The On Q Pain pump System was then placed.  Trocars were placed through the abdominal wall into the subfascial space and these were used to thread the silver soaker cathaters into place.The rectus fascia was then reapproximated with running sutures of Maxon, with careful placement not to incorporate the cathaters. Subcutaneous tissues are then irrigated with saline and hemostasis assured.  Skin is then closed with 4-0 vicryl suture in a subcuticular fashion followed by skin adhesive. The cathaters are flushed each with 5 mL of Bupivicaine and stabilized into place with dressing. Instrument, sponge, and needle counts were correct prior to the abdominal closure and at the conclusion of the case.  The patient tolerated the procedure well and was transferred to the recovery room in stable condition.

## 2015-08-30 NOTE — Progress Notes (Signed)
When performing pericare on patient she stated that she felt like her back was really wet---foley catheter had been leaking.  Talked to C. Sharen HonesGutierrez CNM to report foley had to be d/c due to leak and she stated that is fine but if patient is unable to void catheter will have to be replaced.  Foley d/c per policy and pericare performed (linen change).

## 2015-08-30 NOTE — Anesthesia Preprocedure Evaluation (Signed)
Anesthesia Evaluation  Patient identified by MRN, date of birth, ID band Patient awake    Reviewed: Allergy & Precautions, NPO status , Patient's Chart, lab work & pertinent test results  Airway Mallampati: II       Dental no notable dental hx.    Pulmonary neg pulmonary ROS,    Pulmonary exam normal        Cardiovascular negative cardio ROS Normal cardiovascular exam     Neuro/Psych negative neurological ROS  negative psych ROS   GI/Hepatic negative GI ROS, Neg liver ROS,   Endo/Other  negative endocrine ROS  Renal/GU negative Renal ROS  negative genitourinary   Musculoskeletal negative musculoskeletal ROS (+)   Abdominal Normal abdominal exam  (+)   Peds negative pediatric ROS (+)  Hematology negative hematology ROS (+)   Anesthesia Other Findings   Reproductive/Obstetrics negative OB ROS                             Anesthesia Physical Anesthesia Plan  ASA: II  Anesthesia Plan: Spinal   Post-op Pain Management:    Induction:   Airway Management Planned: Nasal Cannula  Additional Equipment:   Intra-op Plan:   Post-operative Plan:   Informed Consent: I have reviewed the patients History and Physical, chart, labs and discussed the procedure including the risks, benefits and alternatives for the proposed anesthesia with the patient or authorized representative who has indicated his/her understanding and acceptance.     Plan Discussed with: CRNA  Anesthesia Plan Comments:         Anesthesia Quick Evaluation

## 2015-08-30 NOTE — Transfer of Care (Signed)
Immediate Anesthesia Transfer of Care Note  Patient: Alicia [name]  Procedure(s) Performed: Procedure(s): CESAREAN SECTION (N/A)  Patient Location: PACU  Anesthesia Type:Spinal  Level of Consciousness: awake, alert , oriented and patient cooperative  Airway & Oxygen Therapy: Patient Spontanous Breathing  Post-op Assessment: Report given to RN and Post -op Vital signs reviewed and stable  Post vital signs: Reviewed and stable  Last Vitals:  Filed Vitals:   08/30/15 0324  BP: 114/72  Pulse:   Temp: 36.9 C  Resp: 16    Complications: No apparent anesthesia complications

## 2015-08-30 NOTE — Lactation Note (Signed)
This note was copied from the chart of Alicia [name] Christley. Lactation Consultation Note  Patient Name: Alicia [name] Soltau ZOXWR'UToday's Date: 08/30/2015 Reason for consult: Other (Comment) ([facility] breast pump)   Maternal Data  Pt prefers to pump breasts and bottle feed, getting approx. 10cc colostrum each time she pumps and then supplements with formula, given [facility] breast pump for use at home.  Feeding Feeding Type: Breast Fed  LATCH Score/Interventions                      Lactation Tools Discussed/Used WIC Program: No   Consult Status Consult Status: PRN    Dyann KiefMarsha D Rease Wence 08/30/2015, 6:14 PM

## 2015-08-30 NOTE — Progress Notes (Signed)
  Labor Progress Note   24 y.o. G1P0 @ 41w , admitted for  Pregnancy, Labor Management. IOL POST DATES.  Subjective:  Pain tolerated w epidural, on Pitocin 20 mU/min.   Objective:  BP 122/68 mmHg  Pulse 81  Temp(Src) 98.5 F (36.9 C) (Oral)  Resp 16  Ht 5\' 2"  (1.575 m)  Wt 170 lb (77.111 kg)  BMI 31.09 kg/m2  SpO2 100%  LMP 11/16/2014 (Exact Date) Abd: Vtx, tender w Ctxs Extr: trace to 1+ bilateral pedal edema SVE: CERVIX: 7 cm dilated, 80 effaced, -2 station, presenting part Vtx w caput MEMBRANES: ruptured  EFM: FHR: 150 bpm, variability: moderate,  accelerations:  Present,  decelerations:  Absent Toco: Frequency: Every 2-3 minutes, >180 mVu Labs: I have reviewed the patient's lab results.   Assessment & Plan:  G1P0 @ 3063w6d, admitted for IOL, now w No cervical change-Failure to Progress Plan Cesarean; risks d/w pt. The risks of cesarean section discussed with the patient included but were not limited to: bleeding which may require transfusion or reoperation; infection which may require antibiotics; injury to bowel, bladder, ureters or other surrounding organs; injury to the fetus; need for additional procedures including hysterectomy in the event of a life-threatening hemorrhage; placental abnormalities wth subsequent pregnancies, incisional problems, thromboembolic phenomenon and other postoperative/anesthesia complications. The patient concurred with the proposed plan, giving informed written consent for the procedure.

## 2015-08-30 NOTE — Progress Notes (Signed)
Subjective:   Lightheaded while sitting up pumping milk. Exhausted  Objective:  BP 120/58 mmHg  Pulse 84  Temp(Src) 98.5 F (36.9 C) (Oral)  Resp 20  Ht 5\' 2"  (1.575 m)  Wt 170 lb (77.111 kg)  BMI 31.09 kg/m2  SpO2 98%  LMP 11/16/2014 (Exact Date)  Breastfeeding? Unknown  General: NAD, pale Heart: RRR without murmur Pulmonary: no increased work of breathing/ CTA Abdomen: soft, but tympanic, non-tender, fundus firm at level of umbilicus Incision: Dressing C+D+I, ON Q intact Extremities: no edema, no erythema, no tenderness  Results for orders placed or performed during the hospital encounter of 08/28/15 (from the past 72 hour(s))  CBC     Status: Abnormal   Collection Time: 08/28/15  9:32 PM  Result Value Ref Range   WBC 9.8 3.6 - 11.0 K/uL   RBC 3.53 (L) 3.80 - 5.20 MIL/uL   Hemoglobin 8.9 (L) 12.0 - 16.0 g/dL   HCT 40.927.7 (L) 81.135.0 - 91.447.0 %   MCV 78.6 (L) 80.0 - 100.0 fL   MCH 25.3 (L) 26.0 - 34.0 pg   MCHC 32.2 32.0 - 36.0 g/dL   RDW 78.215.9 (H) 95.611.5 - 21.314.5 %   Platelets 195 150 - 440 K/uL  Type and screen [facility]     Status: None   Collection Time: 08/28/15  9:32 PM  Result Value Ref Range   ABO/RH(D) A NEG    Antibody Screen NEG    Sample Expiration 08/31/2015   RPR     Status: None   Collection Time: 08/28/15  9:32 PM  Result Value Ref Range   RPR Ser Ql Non Reactive Non Reactive    Comment: (NOTE) Performed At: [facility] [address] [place], [place] [id number] Mila HomerHancock William F MD GE:[id number]:[phone]   ABO/Rh     Status: None   Collection Time: 08/28/15  9:33 PM  Result Value Ref Range   ABO/RH(D) A NEG   Chlamydia/NGC rt PCR ([facility] only)     Status: None   Collection Time: 08/28/15  9:42 PM  Result Value Ref Range   Specimen source GC/Chlam URINE, CLEAN CATCH    Chlamydia Tr NOT DETECTED NOT DETECTED   N gonorrhoeae NOT DETECTED NOT DETECTED    Comment: (NOTE) 100  This methodology has not been evaluated in pregnant  women or in 200  patients with a history of hysterectomy. 300 400  This methodology will not be performed on patients less than 7814  years of age.   OB RESULTS CONSOLE GC/Chlamydia     Status: None   Collection Time: 08/29/15 12:00 AM  Result Value Ref Range   Gonorrhea Negative    Chlamydia Negative   CBC     Status: Abnormal   Collection Time: 08/30/15  6:46 AM  Result Value Ref Range   WBC 15.8 (H) 3.6 - 11.0 K/uL   RBC 3.07 (L) 3.80 - 5.20 MIL/uL   Hemoglobin 7.8 (L) 12.0 - 16.0 g/dL   HCT 01.024.4 (L) 27.235.0 - 53.647.0 %   MCV 79.5 (L) 80.0 - 100.0 fL   MCH 25.4 (L) 26.0 - 34.0 pg   MCHC 31.9 (L) 32.0 - 36.0 g/dL   RDW 64.415.8 (H) 03.411.5 - 74.214.5 %   Platelets 154 150 - 440 K/uL   Urine output 450ml Appears tea colored  Assessment:   24 y.o. G1P1001 postoperativeday # 0/ 8hrs postop    Plan:  1) Chronic anemia with appropriate decrease in hemoglobin post  CS- hemodynamically stable, some lightheadedness, but exhausted, has not eaten regular food - po ferrous sulfate/vitamins -repeat CBC in AM and consider blood transfusion if remains lightheaded -advance diet as tolerated IV bolus then 61ml/hr LR in addition to Pitocin fluids  2) Mother A NEG/ BAby A POS-Rhogam indicated  3) Breast feeding   Dontarious Schaum

## 2015-08-30 NOTE — Discharge Summary (Addendum)
Obstetrical Discharge Summary  Date of Admission: 08/28/2015 Date of Discharge: 09/01/2015 Discharge Diagnosis: Term Pregnancy-delivered Primary OB:  Alicia Alicia [name]   Gestational Age at Delivery: [redacted]w[redacted]d  Antepartum complications: none Date of Delivery: 08/30/15   Delivered By: Alicia Alicia [name] Delivery Type: primary cesarean section, low transverse incision Intrapartum complications/course: Failure to progress, CPD Anesthesia: spinal Placenta: spontaneous Laceration: n/a Episiotomy: none Live born female  Birth Weight: 9 lb 3.1 oz (4170 g) APGAR: 8, 9   Post partum course: Since the delivery, patient has tolerated activity, diet, and daily functions without difficulty Alicia [name] complication.  Min lochia.  No breast concerns at this time.  No signs of depression currently.  Patient had acute blood loss anemia with dropping hemoglobins to 6.9  Was treated with Methergine and Iron, with improvement and NO blood transfusion administered.   Postpartum Exam: BP 115/78 mmHg  Pulse 77  Temp(Src) 98 F (36.7 C) (Oral)  Resp 18  SpO2 98%    General: NAD CV: RRR Pulm: CTABL, nl effort ABD: s/nd/nt, fundus firm and below the umbilicus Lochia: moderate Incision: c/d/i, covered in surgical glue.  On-q pump in place. DVT Evaluation: LE non-ttp, no evidence of DVT on exam.  Results for orders placed Alicia [name] performed during the hospital encounter of 08/28/15 (from the past 48 hour(s))  CBC     Status: Abnormal   Collection Time: 08/31/15  4:52 AM  Result Value Ref Range   WBC 12.2 (H) 3.6 - 11.0 K/uL   RBC 2.86 (L) 3.80 - 5.20 MIL/uL   Hemoglobin 7.4 (L) 12.0 - 16.0 g/dL   HCT 29.5 (L) 62.1 - 30.8 %   MCV 79.8 (L) 80.0 - 100.0 fL   MCH 25.8 (L) 26.0 - 34.0 pg   MCHC 32.4 32.0 - 36.0 g/dL   RDW 65.7 (H) 84.6 - 96.2 %   Platelets 156 150 - 440 K/uL  Hemoglobin and hematocrit, blood     Status: Abnormal   Collection Time: 08/31/15 11:51 AM  Result Value Ref Range   Hemoglobin 7.0 (L) 12.0 - 16.0 g/dL   HCT  95.2 (L) 84.1 - 47.0 %  Hemoglobin and hematocrit, blood     Status: Abnormal   Collection Time: 08/31/15  3:00 PM  Result Value Ref Range   Hemoglobin 6.9 (L) 12.0 - 16.0 g/dL   HCT 32.4 (L) 40.1 - 02.7 %  Hemoglobin and hematocrit, blood     Status: Abnormal   Collection Time: 08/31/15  8:01 PM  Result Value Ref Range   Hemoglobin 7.9 (L) 12.0 - 16.0 g/dL   HCT 25.3 (L) 66.4 - 40.3 %  Hemoglobin and hematocrit, blood     Status: Abnormal   Collection Time: 09/01/15 12:28 AM  Result Value Ref Range   Hemoglobin 8.1 (L) 12.0 - 16.0 g/dL   HCT 47.4 (L) 25.9 - 56.3 %    Disposition: home with infant Rh Immune globulin given: no Rubella vaccine given: no Varicella vaccine given: no Tdap vaccine given in AP Alicia [name] PP setting: yes Flu vaccine given in AP Alicia [name] PP setting: yes Contraception: to be determined at post partum visit  Prenatal Labs: A NEG//Rubella Immune//RPR negative//HIV negative/HepB Surface Ag negative//plans to breastfeed  Plan:  Alicia Alicia [name] was discharged to home in good condition. Follow-up appointment with [facility] provider in 1 week  Discharge Medications:   Medication List    TAKE these medications        acetaminophen 325 MG tablet  Commonly known as:  TYLENOL  Take 2 tablets (650 mg total) by mouth every 4 (four) hours as needed (for pain scale < 4).     ferrous gluconate 324 MG tablet  Commonly known as:  FERGON  Take 1 tablet (324 mg total) by mouth 3 (three) times daily with meals.     ibuprofen 600 MG tablet  Commonly known as:  ADVIL,MOTRIN  Take 1 tablet (600 mg total) by mouth every 6 (six) hours.     methylergonovine 0.2 MG tablet  Commonly known as:  METHERGINE  Take 1 tablet (0.2 mg total) by mouth 3 (three) times daily.     oxyCODONE-acetaminophen 5-325 MG tablet  Commonly known as:  PERCOCET/ROXICET  Take 1 tablet by mouth every 4 (four) hours as needed for moderate pain.     prenatal multivitamin Tabs tablet  Take 1 tablet by mouth  daily at 12 noon.        Follow-up arrangements:  Follow-up Information    Follow up with Alicia LibraHARRIS,ROBERT PAUL, MD In 6 weeks.   Specialty:  Obstetrics and Gynecology   Why:  for routine post partum check   Contact information:   [address] [place] [address] [phone]       Follow up with Alicia LibraHARRIS,ROBERT PAUL, MD In 1 week.   Specialty:  Obstetrics and Gynecology   Why:  for incision check, general well being.   Contact information:   [address] [place] [address] [phone]      ----- Ranae Plumberhelsea Meria Crilly, MD Attending Obstetrician and Gynecologist [name]

## 2015-08-31 ENCOUNTER — Inpatient Hospital Stay: Payer: 59

## 2015-08-31 LAB — CBC
HCT: 22.8 % — ABNORMAL LOW (ref 35.0–47.0)
HEMOGLOBIN: 7.4 g/dL — AB (ref 12.0–16.0)
MCH: 25.8 pg — AB (ref 26.0–34.0)
MCHC: 32.4 g/dL (ref 32.0–36.0)
MCV: 79.8 fL — AB (ref 80.0–100.0)
Platelets: 156 10*3/uL (ref 150–440)
RBC: 2.86 MIL/uL — ABNORMAL LOW (ref 3.80–5.20)
RDW: 16.3 % — ABNORMAL HIGH (ref 11.5–14.5)
WBC: 12.2 10*3/uL — ABNORMAL HIGH (ref 3.6–11.0)

## 2015-08-31 LAB — PREPARE RBC (CROSSMATCH)

## 2015-08-31 LAB — HEMOGLOBIN AND HEMATOCRIT, BLOOD
HCT: 21.5 % — ABNORMAL LOW (ref 35.0–47.0)
HCT: 24.6 % — ABNORMAL LOW (ref 35.0–47.0)
HEMATOCRIT: 21.8 % — AB (ref 35.0–47.0)
HEMOGLOBIN: 7 g/dL — AB (ref 12.0–16.0)
Hemoglobin: 6.9 g/dL — ABNORMAL LOW (ref 12.0–16.0)
Hemoglobin: 7.9 g/dL — ABNORMAL LOW (ref 12.0–16.0)

## 2015-08-31 MED ORDER — IBUPROFEN 600 MG PO TABS
600.0000 mg | ORAL_TABLET | Freq: Four times a day (QID) | ORAL | Status: DC
  Administered 2015-08-31 – 2015-09-01 (×4): 600 mg via ORAL
  Filled 2015-08-31 (×4): qty 1

## 2015-08-31 MED ORDER — DIPHENHYDRAMINE HCL 25 MG PO CAPS
25.0000 mg | ORAL_CAPSULE | Freq: Once | ORAL | Status: DC
Start: 2015-08-31 — End: 2015-09-01

## 2015-08-31 MED ORDER — ACETAMINOPHEN 325 MG PO TABS: 650.0000 mg | ORAL_TABLET | Freq: Once | ORAL | Status: DC

## 2015-08-31 MED ORDER — METHYLERGONOVINE MALEATE 0.2 MG PO TABS
0.2000 mg | ORAL_TABLET | Freq: Three times a day (TID) | ORAL | Status: DC
  Administered 2015-08-31 – 2015-09-01 (×3): 0.2 mg via ORAL
  Filled 2015-08-31 (×3): qty 1

## 2015-08-31 MED ORDER — SODIUM CHLORIDE 0.9 % IV SOLN
Freq: Once | INTRAVENOUS | Status: DC
Start: 2015-08-31 — End: 2015-09-01

## 2015-08-31 MED ORDER — FUROSEMIDE 10 MG/ML IJ SOLN
20.0000 mg | Freq: Once | INTRAMUSCULAR | Status: DC
  Filled 2015-08-31: qty 2

## 2015-08-31 MED ORDER — RHO D IMMUNE GLOBULIN 1500 UNIT/2ML IJ SOSY
300.0000 ug | PREFILLED_SYRINGE | Freq: Once | INTRAMUSCULAR | Status: AC
  Administered 2015-08-31: 300 ug via INTRAMUSCULAR
  Filled 2015-08-31: qty 2

## 2015-08-31 MED ORDER — FERROUS GLUCONATE 324 (38 FE) MG PO TABS
324.0000 mg | ORAL_TABLET | Freq: Three times a day (TID) | ORAL | Status: DC
  Administered 2015-08-31 – 2015-09-01 (×3): 324 mg via ORAL
  Filled 2015-08-31 (×9): qty 1

## 2015-08-31 MED ORDER — METHYLERGONOVINE MALEATE 0.2 MG PO TABS: 0.2000 mg | ORAL_TABLET | Freq: Three times a day (TID) | ORAL | Status: DC

## 2015-08-31 NOTE — Progress Notes (Addendum)
Postpartum progress note  This morning's exam and Hb of 7.4 was concerning for continued bleeding and acute blood loss anemia.  Rechecked Hb around noon, and was 7.0.  Rechecked at 3pm and was 6.9.  Patient remains otherwise asymptomatic with vital signs WNL.  Initiated transfusion of PRBCs, 2 units, and ordered bedside ultrasound for assessment of uterine cavity, as well as pelvis for abdominal fluid collection.  Will consider D&C (s/p cesarean, thus no cervical dilation)  ----- Ranae Plumberhelsea Caden Fatica, MD Attending Obstetrician and Gynecologist [facility]

## 2015-08-31 NOTE — Anesthesia Post-op Follow-up Note (Signed)
  Anesthesia Pain Follow-up Note  Patient: Alicia [name]  Day #: 1  Date of Follow-up: 08/31/2015 Time: 12:20 PM  Last Vitals:  Filed Vitals:   08/31/15 0816  BP: 105/65  Pulse:   Temp: 36.8 C  Resp: 18    Level of Consciousness: alert  Pain: mild   Side Effects:None  Catheter Site Exam:clean, dry  Plan: Continue current therapy and D/C from anesthesia care  Lenard SimmerAndrew Joseph Johns

## 2015-08-31 NOTE — Progress Notes (Addendum)
POD 1 Subjective:   Feeling better than yesterday, not lightheaded anymore, ambulating without difficulty, voiding spontaneously, breastfeeding/pumping,   Objective:  BP 105/65 mmHg  Pulse 81  Temp(Src) 98.2 F (36.8 C) (Oral)  Resp 18  SpO2 99%   General: NAD, pale Heart: RRR without murmur Pulmonary: no increased work of breathing/ CTA Abdomen: soft, but tympanic, non-tender, fundus firm 2cm above level of umbilicus Incision: Dressing removed, incision C+D+I with surgical glue, ON Q intact Extremities: no edema, no erythema, no tenderness  Results for orders placed or performed during the hospital encounter of 08/28/15 (from the past 72 hour(s))  CBC     Status: Abnormal   Collection Time: 08/28/15  9:32 PM  Result Value Ref Range   WBC 9.8 3.6 - 11.0 K/uL   RBC 3.53 (L) 3.80 - 5.20 MIL/uL   Hemoglobin 8.9 (L) 12.0 - 16.0 g/dL   HCT 16.1 (L) 09.6 - 04.5 %   MCV 78.6 (L) 80.0 - 100.0 fL   MCH 25.3 (L) 26.0 - 34.0 pg   MCHC 32.2 32.0 - 36.0 g/dL   RDW 40.9 (H) 81.1 - 91.4 %   Platelets 195 150 - 440 K/uL  Type and screen [facility]     Status: None   Collection Time: 08/28/15  9:32 PM  Result Value Ref Range   ABO/RH(D) A NEG    Antibody Screen NEG    Sample Expiration 08/31/2015   RPR     Status: None   Collection Time: 08/28/15  9:32 PM  Result Value Ref Range   RPR Ser Ql Non Reactive Non Reactive    Comment: (NOTE) Performed At: [facility] [address] [place], [place] [id number] Mila Homer MD [id number]   ABO/Rh     Status: None   Collection Time: 08/28/15  9:33 PM  Result Value Ref Range   ABO/RH(D) A NEG   Chlamydia/NGC rt PCR ([facility] only)     Status: None   Collection Time: 08/28/15  9:42 PM  Result Value Ref Range   Specimen source GC/Chlam URINE, CLEAN CATCH    Chlamydia Tr NOT DETECTED NOT DETECTED   N gonorrhoeae NOT DETECTED NOT DETECTED    Comment: (NOTE) 100  This methodology has not been evaluated in  pregnant women or in 200  patients with a history of hysterectomy. 300 400  This methodology will not be performed on patients less than 41  years of age.   OB RESULTS CONSOLE GC/Chlamydia     Status: None   Collection Time: 08/29/15 12:00 AM  Result Value Ref Range   Gonorrhea Negative    Chlamydia Negative   CBC     Status: Abnormal   Collection Time: 08/30/15  6:46 AM  Result Value Ref Range   WBC 15.8 (H) 3.6 - 11.0 K/uL   RBC 3.07 (L) 3.80 - 5.20 MIL/uL   Hemoglobin 7.8 (L) 12.0 - 16.0 g/dL   HCT 52.8 (L) 41.3 - 24.4 %   MCV 79.5 (L) 80.0 - 100.0 fL   MCH 25.4 (L) 26.0 - 34.0 pg   MCHC 31.9 (L) 32.0 - 36.0 g/dL   RDW 01.0 (H) 27.2 - 53.6 %   Platelets 154 150 - 440 K/uL  CBC     Status: Abnormal   Collection Time: 08/31/15  4:52 AM  Result Value Ref Range   WBC 12.2 (H) 3.6 - 11.0 K/uL   RBC 2.86 (L) 3.80 - 5.20 MIL/uL   Hemoglobin  7.4 (L) 12.0 - 16.0 g/dL   HCT 16.122.8 (L) 09.635.0 - 04.547.0 %   MCV 79.8 (L) 80.0 - 100.0 fL   MCH 25.8 (L) 26.0 - 34.0 pg   MCHC 32.4 32.0 - 36.0 g/dL   RDW 40.916.3 (H) 81.111.5 - 91.414.5 %   Platelets 156 150 - 440 K/uL     Assessment:   24 y.o. G1P1001 postoperativeday #1 s/p primary LTCS for failure to progress.    Plan:  1) Chronic anemia with inappropriate decrease in hemoglobin post CS- hemodynamically stable, will reorder Hb now, and see if trend is plateauing or still dropping if <7 or becomes symptomatic will give PRBCs.   - po iron TID -methergine 0.20mg  TID -reg diet  2) Mother A NEG/ BAby A POS-Rhogam indicated, per blood bank - in progress  3) Breast feeding  4) disposition: likely discharge POD 3 or 4 depending on recovery and resolution of acute blood loss anemia.  ----- Ranae Plumberhelsea Ward, MD Attending Obstetrician and Gynecologist [facility]

## 2015-08-31 NOTE — Anesthesia Postprocedure Evaluation (Signed)
  Anesthesia Post-op Note  Patient: Alicia [name]  Procedure(s) Performed: Procedure(s): CESAREAN SECTION (N/A)  Anesthesia type:Spinal  Patient location: PACU  Post pain: Pain level controlled  Post assessment: Post-op Vital signs reviewed, Patient's Cardiovascular Status Stable, Respiratory Function Stable, Patent Airway and No signs of Nausea or vomiting  Post vital signs: Reviewed and stable  Last Vitals:  Filed Vitals:   08/31/15 0816  BP: 105/65  Pulse:   Temp: 36.8 C  Resp: 18    Level of consciousness: awake, alert  and patient cooperative  Complications: No apparent anesthesia complications

## 2015-08-31 NOTE — Anesthesia Postprocedure Evaluation (Signed)
  Anesthesia Post-op Note  Patient: Alicia [name]  Procedure(s) Performed: * No procedures listed *  Anesthesia type:Epidural  Patient location: PACU  Post pain: Pain level controlled  Post assessment: Post-op Vital signs reviewed, Patient's Cardiovascular Status Stable, Respiratory Function Stable, Patent Airway and No signs of Nausea or vomiting  Post vital signs: Reviewed and stable  Last Vitals:  Filed Vitals:   08/31/15 0816  BP: 105/65  Pulse:   Temp: 36.8 C  Resp: 18    Level of consciousness: awake, alert  and patient cooperative  Complications: No apparent anesthesia complications

## 2015-08-31 NOTE — Progress Notes (Signed)
Post partum progress note  I went to check on patient to see how it was going with the blood transfusion and how she was feeling.  She had not yet received any blood.  She did, however, look much less pale, and her lips had colour returned to them.  She also said she felt better.  Ultrasound showed some heterogeneity in the uterus but no retained products or significant hemorrhage within.  I reviewed the images.    On physical exam her uterus was now below the umbilicus by 2cm.    Due to the change in her disposition and the delay in administration of the blood products, I decided to take another hemoglobin to see if there was an improvement due to methergine administration.  Results are below:  Results for orders placed or performed during the hospital encounter of 08/28/15 (from the past 24 hour(s))  CBC     Status: Abnormal   Collection Time: 08/31/15  4:52 AM  Result Value Ref Range   WBC 12.2 (H) 3.6 - 11.0 K/uL   RBC 2.86 (L) 3.80 - 5.20 MIL/uL   Hemoglobin 7.4 (L) 12.0 - 16.0 g/dL   HCT 16.122.8 (L) 09.635.0 - 04.547.0 %   MCV 79.8 (L) 80.0 - 100.0 fL   MCH 25.8 (L) 26.0 - 34.0 pg   MCHC 32.4 32.0 - 36.0 g/dL   RDW 40.916.3 (H) 81.111.5 - 91.414.5 %   Platelets 156 150 - 440 K/uL  Hemoglobin and hematocrit, blood     Status: Abnormal   Collection Time: 08/31/15 11:51 AM  Result Value Ref Range   Hemoglobin 7.0 (L) 12.0 - 16.0 g/dL   HCT 78.221.5 (L) 95.635.0 - 21.347.0 %  Hemoglobin and hematocrit, blood     Status: Abnormal   Collection Time: 08/31/15  3:00 PM  Result Value Ref Range   Hemoglobin 6.9 (L) 12.0 - 16.0 g/dL   HCT 08.621.8 (L) 57.835.0 - 46.947.0 %  Hemoglobin and hematocrit, blood     Status: Abnormal   Collection Time: 08/31/15  8:01 PM  Result Value Ref Range   Hemoglobin 7.9 (L) 12.0 - 16.0 g/dL   HCT 62.924.6 (L) 52.835.0 - 41.347.0 %   Due to the improvement of her anemia, both in laboratory and  symptomatic parameters, I am holding off on transfusion for now.  Will repeat H/H at midnight to confirm.  Patient  agreeable to plan.  ----- Ranae Plumberhelsea Kishaun Erekson, MD Attending Obstetrician and Gynecologist [name]

## 2015-09-01 LAB — TYPE AND SCREEN
ABO/RH(D): A NEG
ANTIBODY SCREEN: NEGATIVE
UNIT DIVISION: 0
Unit division: 0

## 2015-09-01 LAB — HEMOGLOBIN AND HEMATOCRIT, BLOOD
HCT: 25.4 % — ABNORMAL LOW (ref 35.0–47.0)
Hemoglobin: 8.1 g/dL — ABNORMAL LOW (ref 12.0–16.0)

## 2015-09-01 MED ORDER — IBUPROFEN 600 MG PO TABS: 600.0000 mg | ORAL_TABLET | Freq: Four times a day (QID) | ORAL | Status: DC

## 2015-09-01 MED ORDER — METHYLERGONOVINE MALEATE 0.2 MG PO TABS: 0.2000 mg | ORAL_TABLET | Freq: Three times a day (TID) | ORAL | Status: AC

## 2015-09-01 MED ORDER — ACETAMINOPHEN 325 MG PO TABS: 650.0000 mg | ORAL_TABLET | ORAL | Status: DC | PRN

## 2015-09-01 MED ORDER — OXYCODONE-ACETAMINOPHEN 5-325 MG PO TABS: 1.0000 | ORAL_TABLET | ORAL | Status: DC | PRN

## 2015-09-01 MED ORDER — FERROUS GLUCONATE 324 (38 FE) MG PO TABS: 324.0000 mg | ORAL_TABLET | Freq: Three times a day (TID) | ORAL | Status: DC

## 2015-09-01 NOTE — Progress Notes (Signed)
Discharge orders reviewed with pt. Pt v/u of instructions. ID bands of mom and infant matched. Escorted by nursing via w/c in stable condition. Ruta HindsKelly Kelcey Korus, RN 09/01/15 [phone]

## 2015-09-01 NOTE — Discharge Instructions (Signed)
Anemia, Nonspecific Anemia is a condition in which the concentration of red blood cells or hemoglobin in the blood is below normal. Hemoglobin is a substance in red blood cells that carries oxygen to the tissues of the body. Anemia results in not enough oxygen reaching these tissues.  CAUSES  Common causes of anemia include:   Excessive bleeding. Bleeding may be internal or external. This includes excessive bleeding from periods (in women) or from the intestine.   Poor nutrition.   Chronic kidney, thyroid, and liver disease.  Bone marrow disorders that decrease red blood cell production.  Cancer and treatments for cancer.  HIV, AIDS, and their treatments.  Spleen problems that increase red blood cell destruction.  Blood disorders.  Excess destruction of red blood cells due to infection, medicines, and autoimmune disorders. SIGNS AND SYMPTOMS   Minor weakness.   Dizziness.   Headache.  Palpitations.   Shortness of breath, especially with exercise.   Paleness.  Cold sensitivity.  Indigestion.  Nausea.  Difficulty sleeping.  Difficulty concentrating. Symptoms may occur suddenly or they may develop slowly.  DIAGNOSIS  Additional blood tests are often needed. These help your health care provider determine the best treatment. Your health care provider will check your stool for blood and look for other causes of blood loss.  TREATMENT  Treatment varies depending on the cause of the anemia. Treatment can include:   Supplements of iron, vitamin B12, or folic acid.   Hormone medicines.   A blood transfusion. This may be needed if blood loss is severe.   Hospitalization. This may be needed if there is significant continual blood loss.   Dietary changes.  Spleen removal. HOME CARE INSTRUCTIONS Keep all follow-up appointments. It often takes many weeks to correct anemia, and having your health care provider check on your condition and your response to  treatment is very important. SEEK IMMEDIATE MEDICAL CARE IF:   You develop extreme weakness, shortness of breath, or chest pain.   You become dizzy or have trouble concentrating.  You develop heavy vaginal bleeding.   You develop a rash.   You have bloody or black, tarry stools.   You faint.   You vomit up blood.   You vomit repeatedly.   You have abdominal pain.  You have a fever or persistent symptoms for more than 2-3 days.   You have a fever and your symptoms suddenly get worse.   You are dehydrated.  MAKE SURE YOU:  Understand these instructions.  Will watch your condition.  Will get help right away if you are not doing well or get worse.   This information is not intended to replace advice given to you by your health care provider. Make sure you discuss any questions you have with your health care provider.   Document Released: 12/10/2004 Document Revised: 07/05/2013 Document Reviewed: 04/28/2013 Elsevier Interactive Patient Education 2016 [employer] Inc[employer]  

## 2016-01-21 ENCOUNTER — Ambulatory Visit
Admission: EM | Admit: 2016-01-21 | Discharge: 2016-01-21 | Disposition: A | Discharge status: Home / Self Care | Payer: 59 | Attending: Family Medicine | Admitting: Family Medicine

## 2016-01-21 ENCOUNTER — Encounter: Payer: Self-pay | Admitting: Emergency Medicine

## 2016-01-21 DIAGNOSIS — L02414 Cutaneous abscess of left upper limb: Secondary | ICD-10-CM

## 2016-01-21 DIAGNOSIS — L03114 Cellulitis of left upper limb: Secondary | ICD-10-CM

## 2016-01-21 DIAGNOSIS — S50862A Insect bite (nonvenomous) of left forearm, initial encounter: Secondary | ICD-10-CM

## 2016-01-21 DIAGNOSIS — L089 Local infection of the skin and subcutaneous tissue, unspecified: Secondary | ICD-10-CM | POA: Diagnosis not present

## 2016-01-21 DIAGNOSIS — W57XXXA Bitten or stung by nonvenomous insect and other nonvenomous arthropods, initial encounter: Secondary | ICD-10-CM

## 2016-01-21 MED ORDER — MUPIROCIN 2 % EX OINT: 1.0000 "application " | TOPICAL_OINTMENT | Freq: Three times a day (TID) | CUTANEOUS | Status: DC

## 2016-01-21 MED ORDER — SULFAMETHOXAZOLE-TRIMETHOPRIM 800-160 MG PO TABS
1.0000 | ORAL_TABLET | Freq: Two times a day (BID) | ORAL | Status: DC
Start: 2016-01-21 — End: 2018-01-27

## 2016-01-21 NOTE — ED Provider Notes (Signed)
CSN: [id number]     Arrival date & time 01/21/16  0919 History   First MD Initiated Contact with Patient 01/21/16 1107    Nurses notes were reviewed. Chief Complaint  Patient presents with  . Insect Bite   insect bite on the left proximal forearm. She states that she noticed the bite and is currently on Sunday night. (Consider location/radiation/quality/duration/timing/severity/associated sxs/prior Treatment) HPI  History reviewed. No pertinent past medical history. Past Surgical History  Procedure Laterality Date  . Cesarean section     History reviewed. No pertinent family history. Social History  Substance Use Topics  . Smoking status: Never Smoker   . Smokeless tobacco: None  . Alcohol Use: No   OB History    No data available     Review of Systems  Skin: Positive for rash and wound.  All other systems reviewed and are negative.   Allergies  Review of patient's allergies indicates no known allergies.  Home Medications   Prior to Admission medications   Medication Sig Start Date End Date Taking? Authorizing Provider  norethindrone-ethinyl estradiol-iron (MICROGESTIN FE,GILDESS FE,LOESTRIN FE) 1.5-30 MG-MCG tablet Take 1 tablet by mouth daily.   Yes Historical Provider, MD  mupirocin ointment (BACTROBAN) 2 % Apply 1 application topically 3 (three) times daily. 01/21/16   Hassan Rowan, MD  sulfamethoxazole-trimethoprim (BACTRIM DS,SEPTRA DS) 800-160 MG tablet Take 1 tablet by mouth 2 (two) times daily. 01/21/16   Hassan Rowan, MD  traMADol (ULTRAM) 50 MG tablet Take 1 tablet (50 mg total) by mouth every 6 (six) hours as needed. 05/11/14   Joni Reining Pisciotta, PA-C   Meds Ordered and Administered this Visit  Medications - No data to display  BP 113/67 mmHg  Pulse 116  Temp(Src) 98.4 F (36.9 C) (Tympanic)  Resp 16  Ht  (1.575 m)  Wt 138 lb (62.596 kg)  BMI 25.23 kg/m2  SpO2 100%  LMP 12/31/2015 (Approximate) No data found.   Physical Exam  Constitutional: She is  oriented to person, place, and time. She appears well-developed and well-nourished.  HENT:  Head: Normocephalic and atraumatic.  Eyes: Conjunctivae are normal. Pupils are equal, round, and reactive to light.  Neck: Neck supple.  Musculoskeletal: Normal range of motion.  Neurological: She is alert and oriented to person, place, and time.  Skin: Rash noted. There is erythema.     Looks like insect bite on the left distal forearm area is raised and hyperemic  Psychiatric: She has a normal mood and affect.  Vitals reviewed.   ED Course  Procedures (including critical care time)  Labs Review Labs Reviewed - No data to display  Imaging Review No results found.   Visual Acuity Review  Right Eye Distance:   Left Eye Distance:   Bilateral Distance:    Right Eye Near:   Left Eye Near:    Bilateral Near:         MDM   1. Cellulitis of forearm, left   2. Abscess of forearm, left   3. Nonvenomous insect bite of forearm with infection, left, initial encounter     We'll place on Septra DS 1 tablet twice a day and Bactroban ointment apply to the lesion in 2-3 times a day work note written for today. Follow-up with the lesion becomes bigger factor may have to open this early abscess is present in the left forearm Note: This dictation was prepared with Dragon dictation along with smaller phrase technology. Any transcriptional errors that result from this process  are unintentional.  Hassan RowanEugene Nneka Blanda, MD 01/21/16 1119

## 2016-01-21 NOTE — Discharge Instructions (Signed)
Abscess °An abscess (boil or furuncle) is an infected area on or under the skin. This area is filled with yellowish-white fluid (pus) and other material (debris). °HOME CARE  °· Only take medicines as told by your doctor. °· If you were given antibiotic medicine, take it as directed. Finish the medicine even if you start to feel better. °· If gauze is used, follow your doctor's directions for changing the gauze. °· To avoid spreading the infection: °¨ Keep your abscess covered with a bandage. °¨ Wash your hands well. °¨ Do not share personal care items, towels, or whirlpools with others. °¨ Avoid skin contact with others. °· Keep your skin and clothes clean around the abscess. °· Keep all doctor visits as told. °GET HELP RIGHT AWAY IF:  °· You have more pain, puffiness (swelling), or redness in the wound site. °· You have more fluid or blood coming from the wound site. °· You have muscle aches, chills, or you feel sick. °· You have a fever. °MAKE SURE YOU:  °· Understand these instructions. °· Will watch your condition. °· Will get help right away if you are not doing well or get worse. °  °This information is not intended to replace advice given to you by your health care provider. Make sure you discuss any questions you have with your health care provider. °  °Document Released: 04/20/2008 Document Revised: 05/03/2012 Document Reviewed: 01/16/2012 °Elsevier Interactive Patient Education ©2016 [employer] ° °

## 2016-01-21 NOTE — ED Notes (Signed)
Patient c/o redness, swelling and tenderness at an insect bite on her left arm for the past 2 days. Patient denies fevers.

## 2016-05-07 ENCOUNTER — Encounter: Payer: Self-pay | Admitting: Emergency Medicine

## 2017-02-22 DIAGNOSIS — Z0100 Encounter for examination of eyes and vision without abnormal findings: Secondary | ICD-10-CM | POA: Diagnosis not present

## 2017-03-09 ENCOUNTER — Telehealth: Payer: 59 | Admitting: Family

## 2017-03-09 DIAGNOSIS — N390 Urinary tract infection, site not specified: Secondary | ICD-10-CM

## 2017-03-09 MED ORDER — CEPHALEXIN 500 MG PO CAPS: 500.0000 mg | ORAL_CAPSULE | Freq: Two times a day (BID) | ORAL | 0 refills | Status: DC

## 2017-03-09 NOTE — Progress Notes (Signed)

## 2017-03-19 IMAGING — US US PELVIS COMPLETE
1 series · 13 of 25 positions shown · non-contrast
Comparison: None.

CLINICAL DATA: Postpartum anemia. Assess for retained products of
conception. C-section on 08/30/2015.

EXAM:
TRANSABDOMINAL ULTRASOUND OF PELVIS
TECHNIQUE: Transabdominal ultrasound examination of the pelvis was performed
including evaluation of the uterus, ovaries, adnexal regions, and
pelvic cul-de-sac.

[Series 1: us pelvis complete · 0.29mm/px · 13 of 37 slices shown]
[im 1/37]
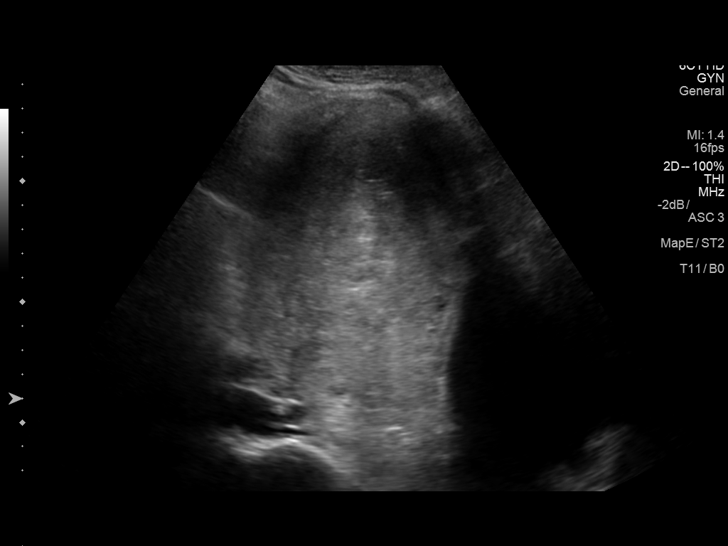
[im 4/37]
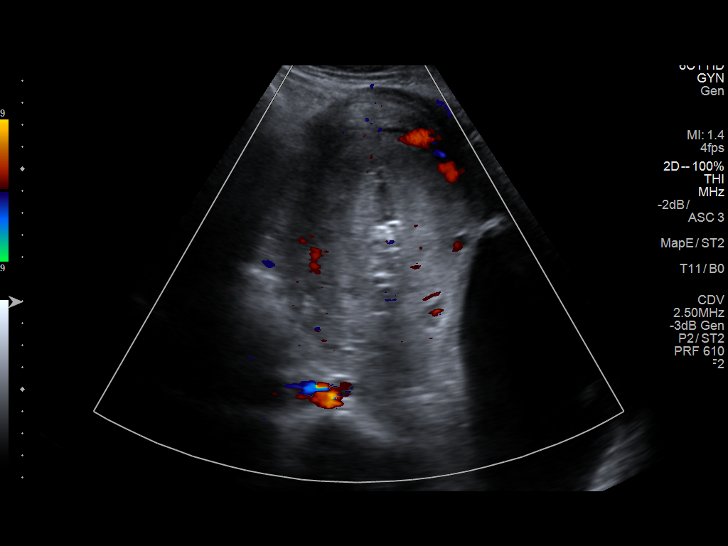
[im 7/37]
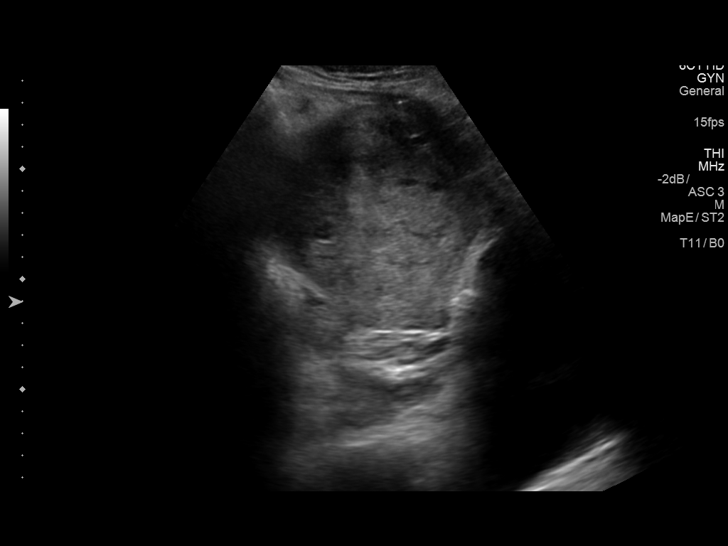
[im 10/37]
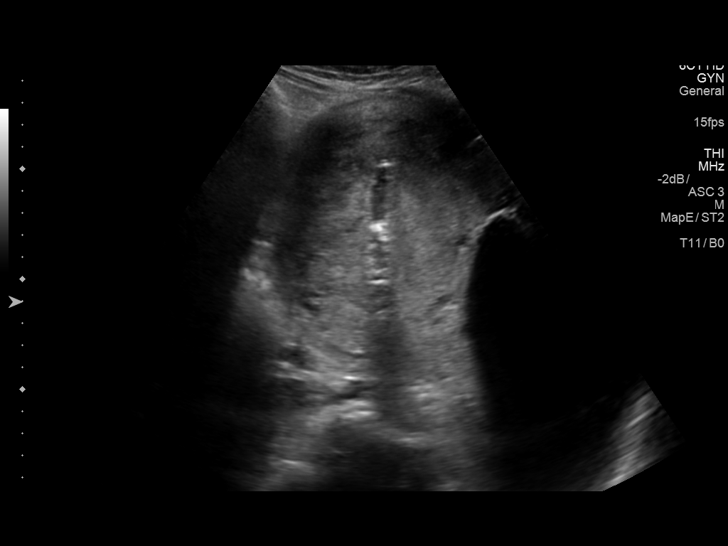
[im 13/37]
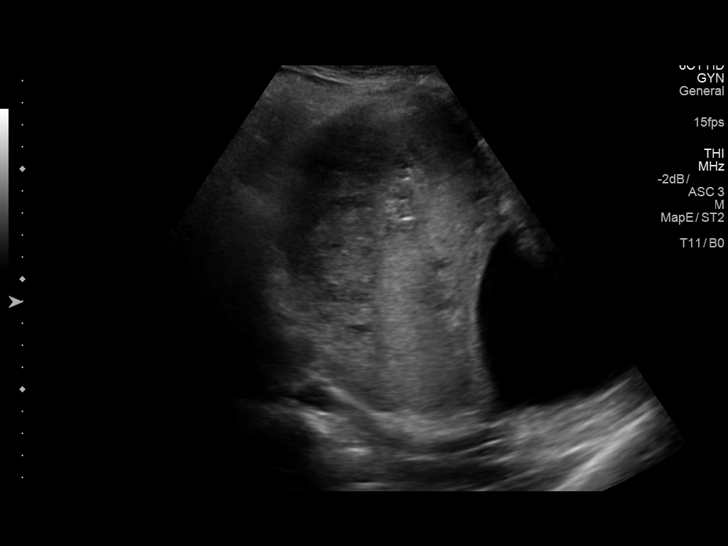
[im 16/37]
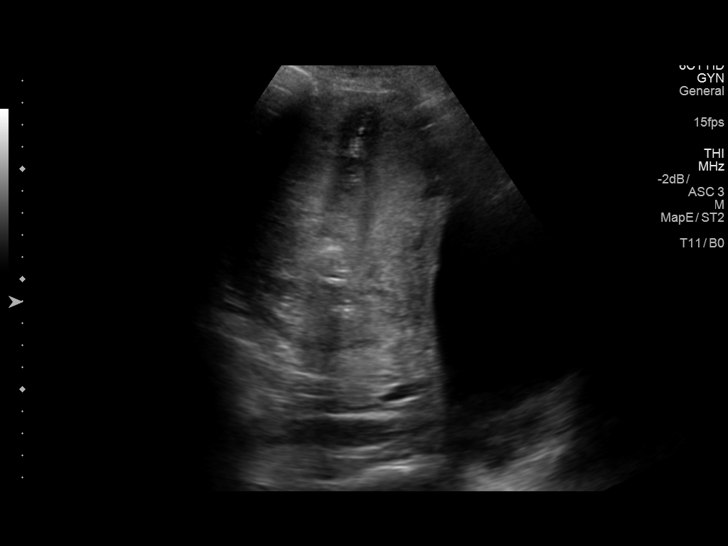
[im 19/37]
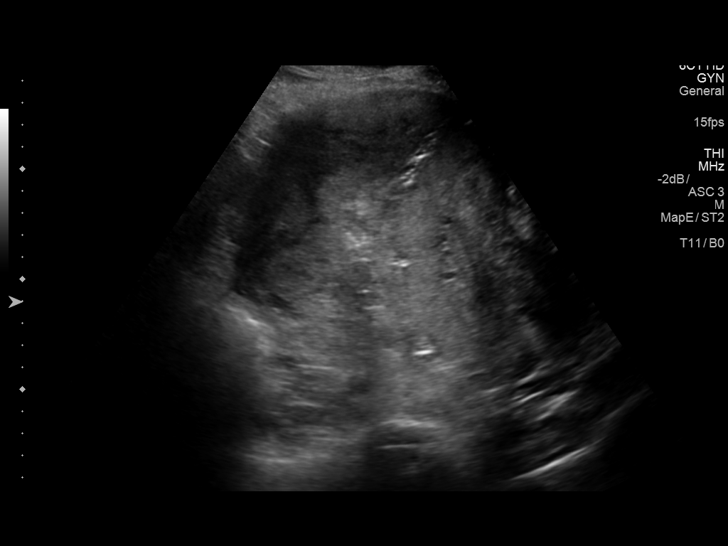
[im 22/37]
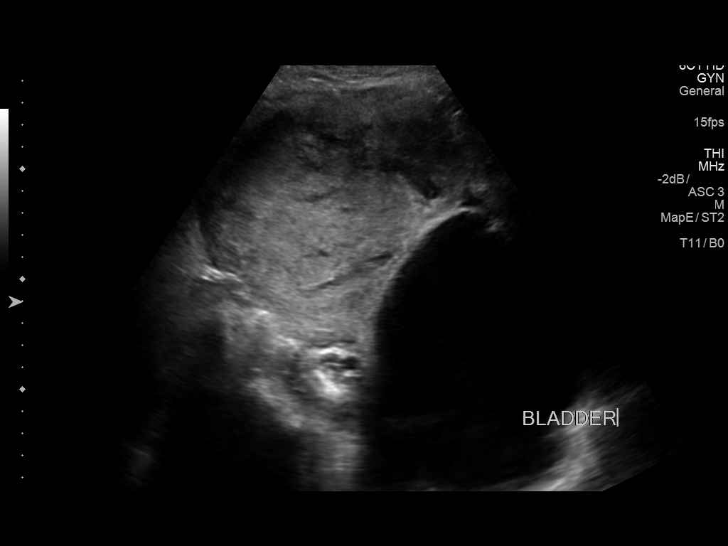
[im 25/37]
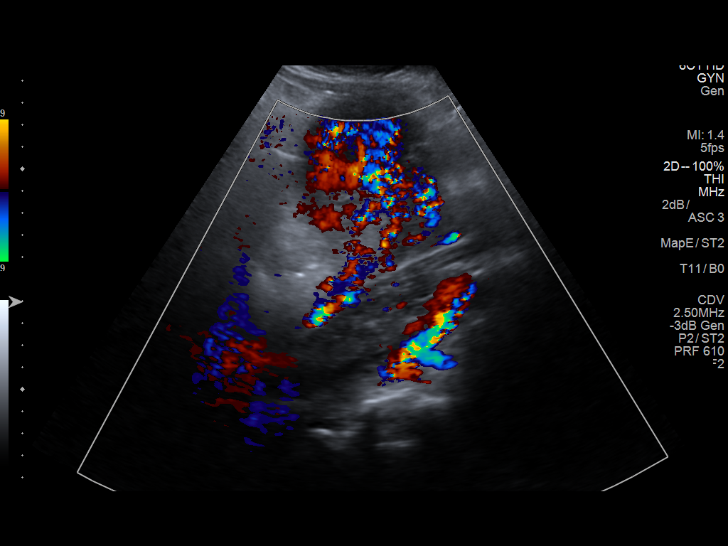
[im 28/37]
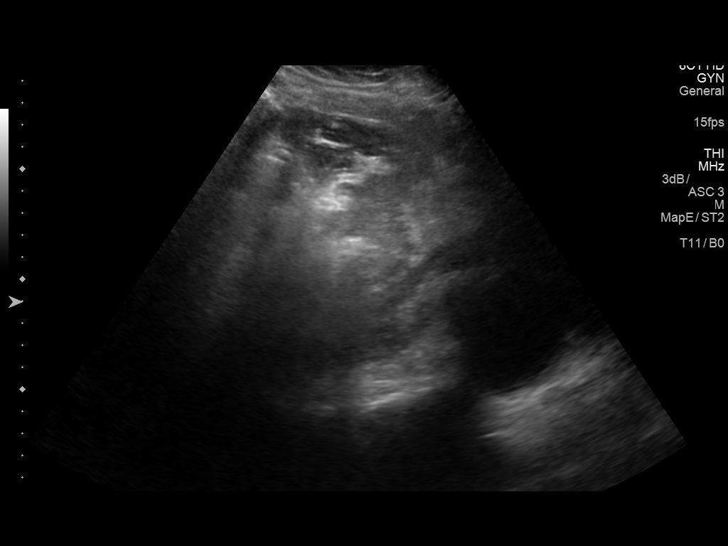
[im 31/37]
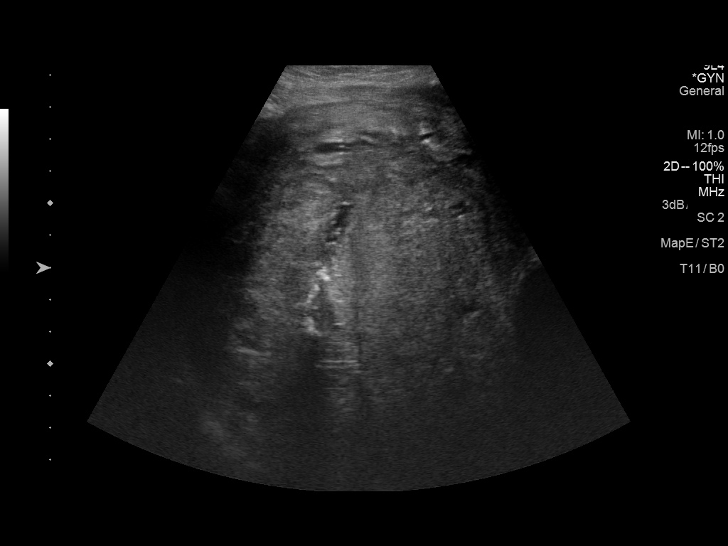
[im 34/37]
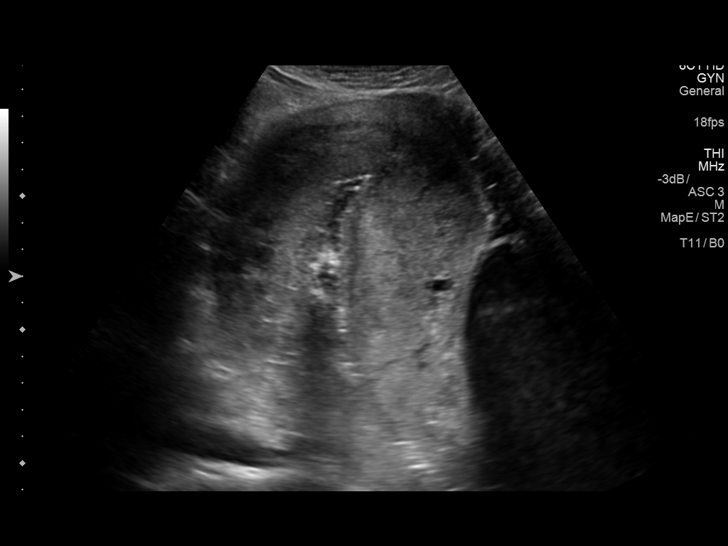
[im 37/37]
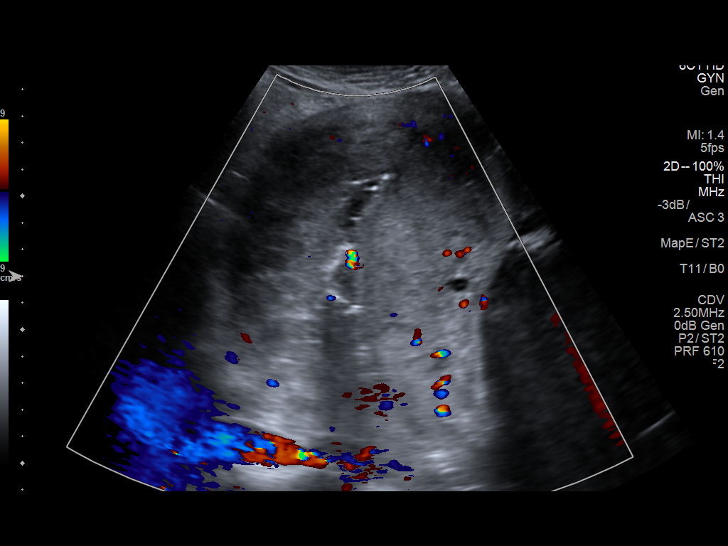

[13 of 25 positions shown; findings below may reference images not displayed]

FINDINGS: Uterus

Measurements: 18.5 x 10.0 x 13.1 cm. No fibroids or other mass
visualized.

Endometrium

Thickness: 4.1 cm. Endometrium is heterogeneous. There is fluid and
echogenic material within the endometrial canal. Echogenic material
is likely air. The material within the endometrial canal shows no
significant blood flow. No convincing retained products of
conception.

Right ovary

Not visualized.  No adnexal mass.

Left ovary

Not visualized.  No adnexal mass.

Other findings:  No free fluid
IMPRESSION: 1. There is heterogeneous material within the endometrial canal
consistent with a combination of fluid and probable air reflected by
echogenic foci. There is no significant increased blood flow along
the endometrium or within the mid endometrial canal. There is no
convincing retained products of conception. The endometrium is
thickened to 4.1 cm.
2. Enlarged postpartum uterus

## 2017-09-09 ENCOUNTER — Telehealth: Payer: 59 | Admitting: Family

## 2017-09-09 DIAGNOSIS — N390 Urinary tract infection, site not specified: Secondary | ICD-10-CM

## 2017-09-09 MED ORDER — CEPHALEXIN 500 MG PO CAPS: 500.0000 mg | ORAL_CAPSULE | Freq: Two times a day (BID) | ORAL | 0 refills | Status: DC

## 2017-09-09 MED FILL — CEPHALEXIN 500 MG CAPSULE: 500 | 7 days supply | Qty: 14 | Fill #0

## 2017-09-09 NOTE — Progress Notes (Signed)
Thank you for the details you included in the comment boxes. Those details are very helpful in determining the best course of treatment for you and help us to provide the best care. Stop drinking the cranberry juice unless you just enjoy it :-). The evidence is that it does no good unless taken in extremely high amounts likely over 2 gallons daily which would be unhealthy. Just thought you should know that.   We are sorry that you are not feeling well.  Here is how we plan to help!  Based on what you shared with me it looks like you most likely have a simple urinary tract infection.  A UTI (Urinary Tract Infection) is a bacterial infection of the bladder.  Most cases of urinary tract infections are simple to treat but a key part of your care is to encourage you to drink plenty of fluids and watch your symptoms carefully.  I have prescribed Keflex 500 mg twice a day for 7 days.  Your symptoms should gradually improve. Call us if the burning in your urine worsens, you develop worsening fever, back pain or pelvic pain or if your symptoms do not resolve after completing the antibiotic.  Urinary tract infections can be prevented by drinking plenty of water to keep your body hydrated.  Also be sure when you wipe, wipe from front to back and don't hold it in!  If possible, empty your bladder every 4 hours.  Your e-visit answers were reviewed by a board certified advanced clinical practitioner to complete your personal care plan.  Depending on the condition, your plan could have included both over the counter or prescription medications.  If there is a problem please reply  once you have received a response from your provider.  Your safety is important to us.  If you have drug allergies check your prescription carefully.    You can use MyChart to ask questions about today's visit, request a non-urgent call back, or ask for a work or school excuse for 24 hours related to this e-Visit. If it has been  greater than 24 hours you will need to follow up with your provider, or enter a new e-Visit to address those concerns.   You will get an e-mail in the next two days asking about your experience.  I hope that your e-visit has been valuable and will speed your recovery. Thank you for using e-visits.

## 2018-01-27 ENCOUNTER — Telehealth: Payer: 59 | Admitting: Physician Assistant

## 2018-01-27 DIAGNOSIS — A084 Viral intestinal infection, unspecified: Secondary | ICD-10-CM | POA: Diagnosis not present

## 2018-01-27 MED ORDER — ONDANSETRON HCL 4 MG PO TABS: 4.0000 mg | ORAL_TABLET | Freq: Three times a day (TID) | ORAL | 0 refills | Status: DC | PRN

## 2018-01-27 NOTE — Progress Notes (Signed)

## 2018-05-11 ENCOUNTER — Ambulatory Visit: Payer: Self-pay | Admitting: Internal Medicine

## 2018-05-25 ENCOUNTER — Ambulatory Visit: Payer: Self-pay | Admitting: Internal Medicine

## 2018-07-18 ENCOUNTER — Telehealth: Payer: 59 | Admitting: Family

## 2018-07-18 DIAGNOSIS — A499 Bacterial infection, unspecified: Secondary | ICD-10-CM

## 2018-07-18 DIAGNOSIS — N390 Urinary tract infection, site not specified: Secondary | ICD-10-CM

## 2018-07-18 MED ORDER — NITROFURANTOIN MONOHYD MACRO 100 MG PO CAPS: 100.0000 mg | ORAL_CAPSULE | Freq: Two times a day (BID) | ORAL | 0 refills | Status: DC

## 2018-07-18 NOTE — Progress Notes (Signed)

## 2018-09-13 ENCOUNTER — Telehealth: Payer: 59 | Admitting: Nurse Practitioner

## 2018-09-13 DIAGNOSIS — J029 Acute pharyngitis, unspecified: Secondary | ICD-10-CM | POA: Diagnosis not present

## 2018-09-13 NOTE — Progress Notes (Signed)
We are sorry that you are not feeling well.  Here is how we plan to help!  Based on what you have shared with me it looks like you have a condition called pharyngitis.  Pharyngitis is inflammation in the back of the throat which can cause a sore throat, scratchiness and sometimes difficulty swallowing.   Pharyngitis is typically caused by a respiratory virus and will just run its course.  Please keep in mind that your symptoms could last up to 10 days.  For throat pain, we recommend over the counter oral pain relief medications such as acetaminophen or aspirin, or anti-inflammatory medications such as ibuprofen or naproxen sodium.  Topical treatments such as oral throat lozenges or sprays may be used as needed.  Avoid close contact with loved ones, especially the very young and elderly.  Remember to wash your hands thoroughly throughout the day as this is the number one way tot prevent the spread of infection and wipe down door knobs and counters with disinfectant.  After careful review of your answers, I would not recommend and antibiotic for your condition.  Antibiotics should not be used to treat conditions that we suspect are caused by viruses like the virus that causes the common cold or flu.  Providers prescribe antibiotics to treat infections caused by bacteria. Antibiotics are very powerful in treating bacterial infections when they are used properly.  To maintain their effectiveness, they should be used only when necessary.  Overuse of antibiotics has resulted in the development of super bugs that are resistant to treatment!    Home Care:  Only take medications as instructed by your medical team.  Do not drink alcohol while taking these medications.  A steam or ultrasonic humidifier can help congestion.  You can place a towel over your head and breathe in the steam from hot water coming from a faucet.  Avoid close contacts especially the very young and the elderly.  Cover your mouth when  you cough or sneeze.  Always remember to wash your hands.  Get Help Right Away If:  You develop worsening fever or throat pain.  You develop a severe head ache or visual changes.  Your symptoms persist after you have completed your treatment plan.  Make sure you  Understand these instructions.  Will watch your condition.  Will get help right away if you are not doing well or get worse.  Your e-visit answers were reviewed by a board certified advanced clinical practitioner to complete your personal care plan.  Depending on the condition, your plan could have included both over the counter or prescription medications.  If there is a problem please reply  once you have received a response from your provider.  Your safety is important to us.  If you have drug allergies check your prescription carefully.    You can use MyChart to ask questions about todays visit, request a non-urgent call back, or ask for a work or school excuse for 24 hours related to this e-Visit. If it has been greater than 24 hours you will need to follow up with your provider, or enter a new e-Visit to address those concerns.  You will get an e-mail in the next two days asking about your experience.  I hope that your e-visit has been valuable and will speed your recovery. Thank you for using e-visits.    

## 2018-09-22 ENCOUNTER — Telehealth: Payer: 59 | Admitting: Family Medicine

## 2018-09-22 DIAGNOSIS — J019 Acute sinusitis, unspecified: Secondary | ICD-10-CM

## 2018-09-22 MED ORDER — AMOXICILLIN-POT CLAVULANATE 875-125 MG PO TABS: 1.0000 | ORAL_TABLET | Freq: Two times a day (BID) | ORAL | 0 refills | Status: AC

## 2018-09-22 NOTE — Progress Notes (Signed)

## 2019-05-24 ENCOUNTER — Telehealth: Payer: 59 | Admitting: Family

## 2019-05-24 DIAGNOSIS — R399 Unspecified symptoms and signs involving the genitourinary system: Secondary | ICD-10-CM | POA: Diagnosis not present

## 2019-05-24 MED ORDER — CEPHALEXIN 500 MG PO CAPS: 500.0000 mg | ORAL_CAPSULE | Freq: Two times a day (BID) | ORAL | 0 refills | Status: DC

## 2019-05-24 NOTE — Progress Notes (Signed)

## 2019-07-27 ENCOUNTER — Telehealth: Payer: 59 | Admitting: Physician Assistant

## 2019-07-27 DIAGNOSIS — N390 Urinary tract infection, site not specified: Secondary | ICD-10-CM | POA: Diagnosis not present

## 2019-07-27 MED ORDER — CEPHALEXIN 500 MG PO CAPS: 500.0000 mg | ORAL_CAPSULE | Freq: Two times a day (BID) | ORAL | 0 refills | Status: AC

## 2019-07-27 NOTE — Progress Notes (Signed)
We are sorry that you are not feeling well.  Here is how we plan to help!  Based on what you shared with me it looks like you most likely have a simple urinary tract infection.  A UTI (Urinary Tract Infection) is a bacterial infection of the bladder.  Most cases of urinary tract infections are simple to treat but a key part of your care is to encourage you to drink plenty of fluids and watch your symptoms carefully.  I have prescribed Keflex 500 mg twice a day for 7 days.  Your symptoms should gradually improve. Call us if the burning in your urine worsens, you develop worsening fever, back pain or pelvic pain or if your symptoms do not resolve after completing the antibiotic.  Urinary tract infections can be prevented by drinking plenty of water to keep your body hydrated.  Also be sure when you wipe, wipe from front to back and don't hold it in!  If possible, empty your bladder every 4 hours.  Your e-visit answers were reviewed by a board certified advanced clinical practitioner to complete your personal care plan.  Depending on the condition, your plan could have included both over the counter or prescription medications.  If there is a problem please reply  once you have received a response from your provider.  Your safety is important to [place].  If you have drug allergies check your prescription carefully.    You can use MyChart to ask questions about today's visit, request a non-urgent call back, or ask for a work or school excuse for 24 hours related to this e-Visit. If it has been greater than 24 hours you will need to follow up with your provider, or enter a new e-Visit to address those concerns.   You will get an e-mail in the next two days asking about your experience.  I hope that your e-visit has been valuable and will speed your recovery. Thank you for using e-visits.  I spent 5 minutes on this patient

## 2019-07-28 ENCOUNTER — Encounter: Payer: Self-pay | Admitting: Physician Assistant

## 2020-04-13 ENCOUNTER — Telehealth: Payer: 59 | Admitting: Nurse Practitioner

## 2020-04-13 DIAGNOSIS — N390 Urinary tract infection, site not specified: Secondary | ICD-10-CM

## 2020-04-13 MED ORDER — NITROFURANTOIN MONOHYD MACRO 100 MG PO CAPS: 100.0000 mg | ORAL_CAPSULE | Freq: Two times a day (BID) | ORAL | 0 refills | Status: DC

## 2020-04-13 NOTE — Progress Notes (Signed)
We are sorry that you are not feeling well.  Here is how we plan to help!  Based on what you shared with me it looks like you most likely have a simple urinary tract infection.  A UTI (Urinary Tract Infection) is a bacterial infection of the bladder.  Most cases of urinary tract infections are simple to treat but a key part of your care is to encourage you to drink plenty of fluids and watch your symptoms carefully.  I have prescribed MacroBid 100 mg twice a day for 5 days, which should not cause yeast infection.  Your symptoms should gradually improve. Call us if the burning in your urine worsens, you develop worsening fever, back pain or pelvic pain or if your symptoms do not resolve after completing the antibiotic.  Urinary tract infections can be prevented by drinking plenty of water to keep your body hydrated.  Also be sure when you wipe, wipe from front to back and don't hold it in!  If possible, empty your bladder every 4 hours.  Your e-visit answers were reviewed by a board certified advanced clinical practitioner to complete your personal care plan.  Depending on the condition, your plan could have included both over the counter or prescription medications.  If there is a problem please reply  once you have received a response from your provider.  Your safety is important to [place].  If you have drug allergies check your prescription carefully.    You can use MyChart to ask questions about today's visit, request a non-urgent call back, or ask for a work or school excuse for 24 hours related to this e-Visit. If it has been greater than 24 hours you will need to follow up with your provider, or enter a new e-Visit to address those concerns.   You will get an e-mail in the next two days asking about your experience.  I hope that your e-visit has been valuable and will speed your recovery. Thank you for using e-visits.   5-10 minutes spent reviewing and documenting in chart.

## 2020-07-31 ENCOUNTER — Other Ambulatory Visit (HOSPITAL_COMMUNITY)
Admission: RE | Admit: 2020-07-31 | Discharge: 2020-07-31 | Disposition: A | Discharge status: Home / Self Care | Payer: No Typology Code available for payment source | Source: Ambulatory Visit | Attending: Obstetrics & Gynecology | Admitting: Obstetrics & Gynecology

## 2020-07-31 ENCOUNTER — Other Ambulatory Visit: Payer: Self-pay

## 2020-07-31 ENCOUNTER — Ambulatory Visit (INDEPENDENT_AMBULATORY_CARE_PROVIDER_SITE_OTHER): Payer: No Typology Code available for payment source | Admitting: Obstetrics & Gynecology

## 2020-07-31 ENCOUNTER — Encounter: Payer: Self-pay | Admitting: Obstetrics & Gynecology

## 2020-07-31 VITALS — BP 120/80 | Wt 162.0 lb

## 2020-07-31 DIAGNOSIS — O0990 Supervision of high risk pregnancy, unspecified, unspecified trimester: Secondary | ICD-10-CM | POA: Insufficient documentation

## 2020-07-31 DIAGNOSIS — Z369 Encounter for antenatal screening, unspecified: Secondary | ICD-10-CM

## 2020-07-31 DIAGNOSIS — Z124 Encounter for screening for malignant neoplasm of cervix: Secondary | ICD-10-CM | POA: Insufficient documentation

## 2020-07-31 DIAGNOSIS — Z3A01 Less than 8 weeks gestation of pregnancy: Secondary | ICD-10-CM

## 2020-07-31 DIAGNOSIS — N926 Irregular menstruation, unspecified: Secondary | ICD-10-CM

## 2020-07-31 DIAGNOSIS — O0991 Supervision of high risk pregnancy, unspecified, first trimester: Secondary | ICD-10-CM

## 2020-07-31 DIAGNOSIS — Z98891 History of uterine scar from previous surgery: Secondary | ICD-10-CM

## 2020-07-31 NOTE — Progress Notes (Signed)
07/31/2020   Chief Complaint: Missed period  Transfer of Care Patient: no  History of Present Illness: [name] is a 29 y.o. G2P1001 [redacted]w[redacted]d based on Patient's last menstrual period was 06/15/2020 (exact date). with an Estimated Date of Delivery: 03/22/21, with the above CC.   Her periods were: regular periods every 28 days She was using no method when she conceived.  She has Positive signs or symptoms of nausea/vomiting of pregnancy. She has Negative signs or symptoms of miscarriage or preterm labor She identifies Negative Zika risk factors for her and her partner On any different medications around the time she conceived/early pregnancy: No  History of varicella: Yes   ROS: A 12-point review of systems was performed and negative, except as stated in the above HPI.  OBGYN History: As per HPI. OB History  Gravida Para Term Preterm AB Living  2 1 1  0 0 1  SAB TAB Ectopic Multiple Live Births  0 0 0   1    # Outcome Date GA Lbr Len/2nd Weight Sex Delivery Anes PTL Lv  2 Current           1 Term 08/30/15 [redacted]w[redacted]d  9 lb 3.1 oz (4.17 kg) F CS-LTranv EPI, Spinal  LIV    Any issues with any prior pregnancies: no Any prior children are healthy, doing well, without any problems or issues: yes History of pap smears: Yes. Last pap smear years ago. Abnormal: no  History of STIs: No   Past Medical History: Past Medical History:  Diagnosis Date  . Anemia     Past Surgical History: Past Surgical History:  Procedure Laterality Date  . CESAREAN SECTION N/A 08/30/2015   Procedure: CESAREAN SECTION;  Surgeon: 09/01/2015, MD;  Location: [facility] ORS;  Service: Obstetrics;  Laterality: N/A;  . CESAREAN SECTION      Family History:  History reviewed. No pertinent family history. She denies any female cancers, bleeding or blood clotting disorders.  She denies any history of mental retardation, birth defects or genetic disorders in her or the FOB's history  Social History:  Social History     Socioeconomic History  . Marital status: Married    Spouse name: Not on file  . Number of children: Not on file  . Years of education: Not on file  . Highest education level: Not on file  Occupational History  . Not on file  Tobacco Use  . Smoking status: Never Smoker  . Smokeless tobacco: Never Used  Vaping Use  . Vaping Use: Never used  Substance and Sexual Activity  . Alcohol use: No  . Drug use: No  . Sexual activity: Yes  Other Topics Concern  . Not on file  Social History Narrative   ** Merged History Encounter **       Social Determinants of Health   Financial Resource Strain:   . Difficulty of Paying Living Expenses: Not on file  Food Insecurity:   . Worried About Nadara Mustard in the Last Year: Not on file  . Ran Out of Food in the Last Year: Not on file  Transportation Needs:   . Lack of Transportation (Medical): Not on file  . Lack of Transportation (Non-Medical): Not on file  Physical Activity:   . Days of Exercise per Week: Not on file  . Minutes of Exercise per Session: Not on file  Stress:   . Feeling of Stress : Not on file  Social Connections:   . Frequency  of Communication with Friends and Family: Not on file  . Frequency of Social Gatherings with Friends and Family: Not on file  . Attends Religious Services: Not on file  . Active Member of Clubs or Organizations: Not on file  . Attends Banker Meetings: Not on file  . Marital Status: Not on file  Intimate Partner Violence:   . Fear of Current or Ex-Partner: Not on file  . Emotionally Abused: Not on file  . Physically Abused: Not on file  . Sexually Abused: Not on file   Any pets in the household: no  Allergy: No Known Allergies  Current Outpatient Medications:  Current Outpatient Medications:  .  Prenatal Vit-Fe Fumarate-FA (PRENATAL MULTIVITAMIN) TABS tablet, Take 1 tablet by mouth daily at 12 noon., Disp: , Rfl:    Physical Exam:   BP 120/80   Wt 162 lb (73.5  kg)   LMP 06/15/2020 (Exact Date)   BMI 29.63 kg/m  Body mass index is 29.63 kg/m. Constitutional: Well nourished, well developed female in no acute distress.  Neck:  Supple, normal appearance, and no thyromegaly  Cardiovascular: S1, S2 normal, no murmur, rub or gallop, regular rate and rhythm Respiratory:  Clear to auscultation bilateral. Normal respiratory effort Abdomen: positive bowel sounds and no masses, hernias; diffusely non tender to palpation, non distended Breasts: breasts appear normal, no suspicious masses, no skin or nipple changes or axillary nodes. Neuro/Psych:  Normal mood and affect.  Skin:  Warm and dry.  Lymphatic:  No inguinal lymphadenopathy.   Pelvic exam: is not limited by body habitus EGBUS: within normal limits, Vagina: within normal limits and with no blood in the vault, Cervix: normal appearing cervix without discharge or lesions, closed/long/high, Uterus:  enlarged: 6 weeks, and Adnexa:  normal adnexa  Assessment: Alicia [name] is a 29 y.o. G2P1001 [redacted]w[redacted]d based on Patient's last menstrual period was 06/15/2020 (exact date). with an Estimated Date of Delivery: 03/22/21,  for prenatal care.  Plan:  1) Avoid alcoholic beverages. 2) Patient encouraged not to smoke.  3) Discontinue the use of all non-medicinal drugs and chemicals.  4) Take prenatal vitamins daily.  5) Seatbelt use advised 6) Nutrition, food safety (fish, cheese advisories, and high nitrite foods) and exercise discussed. 7) Hospital and practice style delivering at [facility] discussed  8) Patient is asked about travel to areas at risk for the Zika virus, and counseled to avoid travel and exposure to mosquitoes or sexual partners who may have themselves been exposed to the virus. Testing is discussed, and will be ordered as appropriate.  9) Childbirth classes at [facility] advised 10) Genetic Screening, such as with 1st Trimester Screening, cell free fetal DNA, AFP testing, and Ultrasound, as well as with  amniocentesis and CVS as appropriate, is discussed with patient. She plans to have genetic testing this pregnancy. 11) [place] soon 12) Discussed cesarean history, options for CS or VBAC.  Considering R CS. 13) Flu shot at work planned 14) Covid shots UTD  Problem list reviewed and updated.  Annamarie Major, MD, [facility], [facility] 07/31/2020  3:15 PM

## 2020-07-31 NOTE — Patient Instructions (Signed)
First Trimester of Pregnancy The first trimester of pregnancy is from week 1 until the end of week 13 (months 1 through 3). A week after a sperm fertilizes an egg, the egg will implant on the wall of the uterus. This embryo will begin to develop into a baby. Genes from you and your partner will form the baby. The female genes will determine whether the baby will be a boy or a girl. At 6-8 weeks, the eyes and face will be formed, and the heartbeat can be seen on ultrasound. At the end of 12 weeks, all the baby's organs will be formed. Now that you are pregnant, you will want to do everything you can to have a healthy baby. Two of the most important things are to get good prenatal care and to follow your health care provider's instructions. Prenatal care is all the medical care you receive before the baby's birth. This care will help prevent, find, and treat any problems during the pregnancy and childbirth. Body changes during your first trimester Your body goes through many changes during pregnancy. The changes vary from woman to woman.  You may gain or lose a couple of pounds at first.  You may feel sick to your stomach (nauseous) and you may throw up (vomit). If the vomiting is uncontrollable, call your health care provider.  You may tire easily.  You may develop headaches that can be relieved by medicines. All medicines should be approved by your health care provider.  You may urinate more often. Painful urination may mean you have a bladder infection.  You may develop heartburn as a result of your pregnancy.  You may develop constipation because certain hormones are causing the muscles that push stool through your intestines to slow down.  You may develop hemorrhoids or swollen veins (varicose veins).  Your breasts may begin to grow larger and become tender. Your nipples may stick out more, and the tissue that surrounds them (areola) may become darker.  Your gums may bleed and may be  sensitive to brushing and flossing.  Dark spots or blotches (chloasma, mask of pregnancy) may develop on your face. This will likely fade after the baby is born.  Your menstrual periods will stop.  You may have a loss of appetite.  You may develop cravings for certain kinds of food.  You may have changes in your emotions from day to day, such as being excited to be pregnant or being concerned that something may go wrong with the pregnancy and baby.  You may have more vivid and strange dreams.  You may have changes in your hair. These can include thickening of your hair, rapid growth, and changes in texture. Some women also have hair loss during or after pregnancy, or hair that feels dry or thin. Your hair will most likely return to normal after your baby is born. What to expect at prenatal visits During a routine prenatal visit:  You will be weighed to make sure you and the baby are growing normally.  Your blood pressure will be taken.  Your abdomen will be measured to track your baby's growth.  The fetal heartbeat will be listened to between weeks 10 and 14 of your pregnancy.  Test results from any previous visits will be discussed. Your health care provider may ask you:  How you are feeling.  If you are feeling the baby move.  If you have had any abnormal symptoms, such as leaking fluid, bleeding, severe headaches, or abdominal   cramping.  If you are using any tobacco products, including cigarettes, chewing tobacco, and electronic cigarettes.  If you have any questions. Other tests that may be performed during your first trimester include:  Blood tests to find your blood type and to check for the presence of any previous infections. The tests will also be used to check for low iron levels (anemia) and protein on red blood cells (Rh antibodies). Depending on your risk factors, or if you previously had diabetes during pregnancy, you may have tests to check for high blood sugar  that affects pregnant women (gestational diabetes).  Urine tests to check for infections, diabetes, or protein in the urine.  An ultrasound to confirm the proper growth and development of the baby.  Fetal screens for spinal cord problems (spina bifida) and Down syndrome.  HIV (human immunodeficiency virus) testing. Routine prenatal testing includes screening for HIV, unless you choose not to have this test.  You may need other tests to make sure you and the baby are doing well. Follow these instructions at home: Medicines  Follow your health care provider's instructions regarding medicine use. Specific medicines may be either safe or unsafe to take during pregnancy.  Take a prenatal vitamin that contains at least 600 micrograms (mcg) of folic acid.  If you develop constipation, try taking a stool softener if your health care provider approves. Eating and drinking   Eat a balanced diet that includes fresh fruits and vegetables, whole grains, good sources of protein such as meat, eggs, or tofu, and low-fat dairy. Your health care provider will help you determine the amount of weight gain that is right for you.  Avoid raw meat and uncooked cheese. These carry germs that can cause birth defects in the baby.  Eating four or five small meals rather than three large meals a day may help relieve nausea and vomiting. If you start to feel nauseous, eating a few soda crackers can be helpful. Drinking liquids between meals, instead of during meals, also seems to help ease nausea and vomiting.  Limit foods that are high in fat and processed sugars, such as fried and sweet foods.  To prevent constipation: ? Eat foods that are high in fiber, such as fresh fruits and vegetables, whole grains, and beans. ? Drink enough fluid to keep your urine clear or pale yellow. Activity  Exercise only as directed by your health care provider. Most women can continue their usual exercise routine during  pregnancy. Try to exercise for 30 minutes at least 5 days a week. Exercising will help you: ? Control your weight. ? Stay in shape. ? Be prepared for labor and delivery.  Experiencing pain or cramping in the lower abdomen or lower back is a good sign that you should stop exercising. Check with your health care provider before continuing with normal exercises.  Try to avoid standing for long periods of time. Move your legs often if you must stand in one place for a long time.  Avoid heavy lifting.  Wear low-heeled shoes and practice good posture.  You may continue to have sex unless your health care provider tells you not to. Relieving pain and discomfort  Wear a good support bra to relieve breast tenderness.  Take warm sitz baths to soothe any pain or discomfort caused by hemorrhoids. Use hemorrhoid cream if your health care provider approves.  Rest with your legs elevated if you have leg cramps or low back pain.  If you develop varicose veins in   your legs, wear support hose. Elevate your feet for 15 minutes, 3-4 times a day. Limit salt in your diet. Prenatal care  Schedule your prenatal visits by the twelfth week of pregnancy. They are usually scheduled monthly at first, then more often in the last 2 months before delivery.  Write down your questions. Take them to your prenatal visits.  Keep all your prenatal visits as told by your health care provider. This is important. Safety  Wear your seat belt at all times when driving.  Make a list of emergency phone numbers, including numbers for family, friends, the hospital, and police and fire departments. General instructions  Ask your health care provider for a referral to a local prenatal education class. Begin classes no later than the beginning of month 6 of your pregnancy.  Ask for help if you have counseling or nutritional needs during pregnancy. Your health care provider can offer advice or refer you to specialists for help  with various needs.  Do not use hot tubs, steam rooms, or saunas.  Do not douche or use tampons or scented sanitary pads.  Do not cross your legs for long periods of time.  Avoid cat litter boxes and soil used by cats. These carry germs that can cause birth defects in the baby and possibly loss of the fetus by miscarriage or stillbirth.  Avoid all smoking, herbs, alcohol, and medicines not prescribed by your health care provider. Chemicals in these products affect the formation and growth of the baby.  Do not use any products that contain nicotine or tobacco, such as cigarettes and e-cigarettes. If you need help quitting, ask your health care provider. You may receive counseling support and other resources to help you quit.  Schedule a dentist appointment. At home, brush your teeth with a soft toothbrush and be gentle when you floss. Contact a health care provider if:  You have dizziness.  You have mild pelvic cramps, pelvic pressure, or nagging pain in the abdominal area.  You have persistent nausea, vomiting, or diarrhea.  You have a bad smelling vaginal discharge.  You have pain when you urinate.  You notice increased swelling in your face, hands, legs, or ankles.  You are exposed to fifth disease or chickenpox.  You are exposed to German measles (rubella) and have never had it. Get help right away if:  You have a fever.  You are leaking fluid from your vagina.  You have spotting or bleeding from your vagina.  You have severe abdominal cramping or pain.  You have rapid weight gain or loss.  You vomit blood or material that looks like coffee grounds.  You develop a severe headache.  You have shortness of breath.  You have any kind of trauma, such as from a fall or a car accident. Summary  The first trimester of pregnancy is from week 1 until the end of week 13 (months 1 through 3).  Your body goes through many changes during pregnancy. The changes vary from  woman to woman.  You will have routine prenatal visits. During those visits, your health care provider will examine you, discuss any test results you may have, and talk with you about how you are feeling. This information is not intended to replace advice given to you by your health care provider. Make sure you discuss any questions you have with your health care provider. Document Revised: 10/15/2017 Document Reviewed: 10/14/2016 Elsevier Patient Education  2020 Elsevier Inc.  

## 2020-08-01 ENCOUNTER — Other Ambulatory Visit (HOSPITAL_COMMUNITY)
Admission: RE | Admit: 2020-08-01 | Discharge: 2020-08-01 | Disposition: A | Discharge status: Home / Self Care | Payer: No Typology Code available for payment source | Source: Ambulatory Visit | Attending: Obstetrics & Gynecology | Admitting: Obstetrics & Gynecology

## 2020-08-01 DIAGNOSIS — Z369 Encounter for antenatal screening, unspecified: Secondary | ICD-10-CM | POA: Insufficient documentation

## 2020-08-01 LAB — HEPATITIS B SURFACE ANTIGEN: Hepatitis B Surface Ag: NONREACTIVE

## 2020-08-01 LAB — CBC
HCT: 35.4 % — ABNORMAL LOW (ref 36.0–46.0)
Hemoglobin: 11.7 g/dL — ABNORMAL LOW (ref 12.0–15.0)
MCH: 30.1 pg (ref 26.0–34.0)
MCHC: 33.1 g/dL (ref 30.0–36.0)
MCV: 91 fL (ref 80.0–100.0)
Platelets: 258 10*3/uL (ref 150–400)
RBC: 3.89 MIL/uL (ref 3.87–5.11)
RDW: 13.3 % (ref 11.5–15.5)
WBC: 8.5 10*3/uL (ref 4.0–10.5)
nRBC: 0 % (ref 0.0–0.2)

## 2020-08-01 LAB — DIFFERENTIAL
Abs Immature Granulocytes: 0.03 10*3/uL (ref 0.00–0.07)
Basophils Absolute: 0 10*3/uL (ref 0.0–0.1)
Basophils Relative: 1 %
Eosinophils Absolute: 0.3 10*3/uL (ref 0.0–0.5)
Eosinophils Relative: 3 %
Immature Granulocytes: 0 %
Lymphocytes Relative: 19 %
Lymphs Abs: 1.6 10*3/uL (ref 0.7–4.0)
Monocytes Absolute: 0.9 10*3/uL (ref 0.1–1.0)
Monocytes Relative: 11 %
Neutro Abs: 5.7 10*3/uL (ref 1.7–7.7)
Neutrophils Relative %: 66 %

## 2020-08-01 LAB — RPR: RPR Ser Ql: NONREACTIVE

## 2020-08-02 LAB — CYTOLOGY - PAP
Adequacy: ABSENT
Chlamydia: NEGATIVE
Comment: NEGATIVE
Comment: NORMAL
Diagnosis: NEGATIVE
Neisseria Gonorrhea: NEGATIVE

## 2020-08-02 LAB — RUBELLA SCREEN: Rubella: 23.2 index (ref >0.99)

## 2020-08-08 ENCOUNTER — Encounter: Payer: Self-pay | Admitting: Obstetrics and Gynecology

## 2020-08-19 ENCOUNTER — Encounter: Payer: Self-pay | Admitting: Obstetrics & Gynecology

## 2020-08-19 ENCOUNTER — Ambulatory Visit (INDEPENDENT_AMBULATORY_CARE_PROVIDER_SITE_OTHER): Payer: No Typology Code available for payment source

## 2020-08-19 ENCOUNTER — Other Ambulatory Visit: Payer: Self-pay

## 2020-08-19 ENCOUNTER — Ambulatory Visit (INDEPENDENT_AMBULATORY_CARE_PROVIDER_SITE_OTHER): Payer: No Typology Code available for payment source | Admitting: Obstetrics & Gynecology

## 2020-08-19 VITALS — BP 120/80 | Wt 163.0 lb

## 2020-08-19 DIAGNOSIS — Z3A09 9 weeks gestation of pregnancy: Secondary | ICD-10-CM

## 2020-08-19 DIAGNOSIS — O0991 Supervision of high risk pregnancy, unspecified, first trimester: Secondary | ICD-10-CM

## 2020-08-19 DIAGNOSIS — N926 Irregular menstruation, unspecified: Secondary | ICD-10-CM | POA: Diagnosis not present

## 2020-08-19 LAB — POCT URINALYSIS DIPSTICK OB
Glucose, UA: NEGATIVE
POC,PROTEIN,UA: NEGATIVE

## 2020-08-19 NOTE — Addendum Note (Signed)
Addended by: Cornelius Moras D on: 08/19/2020 04:52 PM   Modules accepted: Orders

## 2020-08-19 NOTE — Progress Notes (Signed)
  Subjective  Fetal Movement? no Contractions? no Leaking Fluid? no Vaginal Bleeding? no  Objective  BP 120/80   Wt 163 lb (73.9 kg)   LMP 06/15/2020 (Exact Date)   BMI 29.81 kg/m  General: NAD Pumonary: no increased work of breathing Abdomen: gravid, non-tender Extremities: no edema Psychiatric: mood appropriate, affect full  Assessment  29 y.o. G2P1001 at [redacted]w[redacted]d by  03/22/2021, by Last Menstrual Period presenting for routine prenatal visit  Plan   Problem List Items Addressed This Visit      Other   High-risk pregnancy, first trimester    Other Visit Diagnoses    [redacted] weeks gestation of pregnancy    -  Primary    Review of ULTRASOUND.    I have personally reviewed images and report of recent ultrasound done at [facility].    Plan of management to be discussed with patient. EDC established by LMP and confirmed w [place] PNV CS vs VBAC discussed, prefers CS now and will cont to consider her options NIPT and labs nv   Annamarie Major, MD, [facility], [facility] 08/19/2020  4:50 PM

## 2020-09-02 ENCOUNTER — Ambulatory Visit (INDEPENDENT_AMBULATORY_CARE_PROVIDER_SITE_OTHER): Payer: No Typology Code available for payment source | Admitting: Obstetrics & Gynecology

## 2020-09-02 ENCOUNTER — Other Ambulatory Visit: Payer: Self-pay

## 2020-09-02 ENCOUNTER — Encounter: Payer: Self-pay | Admitting: Obstetrics & Gynecology

## 2020-09-02 VITALS — BP 118/70 | Wt 161.0 lb

## 2020-09-02 DIAGNOSIS — Z1379 Encounter for other screening for genetic and chromosomal anomalies: Secondary | ICD-10-CM

## 2020-09-02 DIAGNOSIS — Z3A11 11 weeks gestation of pregnancy: Secondary | ICD-10-CM

## 2020-09-02 LAB — POCT URINALYSIS DIPSTICK OB
Glucose, UA: NEGATIVE
POC,PROTEIN,UA: NEGATIVE

## 2020-09-02 NOTE — Progress Notes (Signed)
Prenatal Visit Note Date: 09/02/2020 Clinic: [facility]  Subjective:  Alicia [name] is a 29 y.o. G2P1001 at [redacted]w[redacted]d being seen today for ongoing prenatal care.  She is currently monitored for the following issues for this low-risk pregnancy and has Family history of genetic disease carrier; S/P cesarean section; and High-risk pregnancy, first trimester on their problem list.  Patient reports no complaints.    .  .   . Denies leaking of fluid.   The following portions of the patient's history were reviewed and updated as appropriate: allergies, current medications, past family history, past medical history, past social history, past surgical history and problem list. Problem list updated.  Objective:   Vitals:   09/02/20 1554  BP: 118/70  Weight: 161 lb (73 kg)    Fetal Status:           General:  Alert, oriented and cooperative. Patient is in no acute distress.  Skin: Skin is warm and dry. No rash noted.   Cardiovascular: Normal heart rate noted  Respiratory: Normal respiratory effort, no problems with respiration noted  Abdomen: Soft, gravid, appropriate for gestational age.       Pelvic:  Cervical exam deferred        Extremities: Normal range of motion.     Mental Status: Normal mood and affect. Normal behavior. Normal judgment and thought content.   Urinalysis:      Assessment and Plan:  Pregnancy: G2P1001 at [redacted]w[redacted]d  1. [redacted] weeks gestation of pregnancy - POC Urinalysis Dipstick OB - RPR+Rh+ABO+Rub Ab+Ab Scr+CB...  2. Encounter for genetic screening for Down Syndrome - MaterniT21 PLUS Core+SCA  Preterm and first trimester symptoms and general obstetric precautions including but not limited to nausea, vaginal bleeding, contractions, leaking of fluid and fetal movement were reviewed in detail with the patient. Please refer to After Visit Summary for other counseling recommendations.   PNV F/u 4 weeks  Annamarie Major, MD, [facility], [facility] 09/02/2020  4:34 PM

## 2020-09-02 NOTE — Progress Notes (Signed)
Dizziness.

## 2020-09-03 LAB — RPR+RH+ABO+RUB AB+AB SCR+CB...
Antibody Screen: NEGATIVE
HIV Screen 4th Generation wRfx: NONREACTIVE
Hematocrit: 36.2 % (ref 34.0–46.6)
Hemoglobin: 11.9 g/dL (ref 11.1–15.9)
Hepatitis B Surface Ag: NEGATIVE
MCH: 29.3 pg (ref 26.6–33.0)
MCHC: 32.9 g/dL (ref 31.5–35.7)
MCV: 89 fL (ref 79–97)
Platelets: 251 10*3/uL (ref 150–450)
RBC: 4.06 x10E6/uL (ref 3.77–5.28)
RDW: 12.7 % (ref 11.7–15.4)
RPR Ser Ql: NONREACTIVE
Rh Factor: NEGATIVE
Rubella Antibodies, IGG: 26.3 index (ref >0.99)
Varicella zoster IgG: 1420 index (ref >165)
WBC: 10.1 10*3/uL (ref 3.4–10.8)

## 2020-09-07 LAB — MATERNIT21 PLUS CORE+SCA
Fetal Fraction: 7
Monosomy X (Turner Syndrome): NOT DETECTED
Result (T21): NEGATIVE
Trisomy 13 (Patau syndrome): NEGATIVE
Trisomy 18 (Edwards syndrome): NEGATIVE
Trisomy 21 (Down syndrome): NEGATIVE
XXX (Triple X Syndrome): NOT DETECTED
XXY (Klinefelter Syndrome): NOT DETECTED
XYY (Jacobs Syndrome): NOT DETECTED

## 2020-09-30 ENCOUNTER — Ambulatory Visit (INDEPENDENT_AMBULATORY_CARE_PROVIDER_SITE_OTHER): Payer: No Typology Code available for payment source | Admitting: Obstetrics & Gynecology

## 2020-09-30 ENCOUNTER — Encounter: Payer: Self-pay | Admitting: Obstetrics & Gynecology

## 2020-09-30 ENCOUNTER — Other Ambulatory Visit: Payer: Self-pay

## 2020-09-30 VITALS — BP 100/70 | Wt 164.0 lb

## 2020-09-30 DIAGNOSIS — Z98891 History of uterine scar from previous surgery: Secondary | ICD-10-CM

## 2020-09-30 DIAGNOSIS — Z3A15 15 weeks gestation of pregnancy: Secondary | ICD-10-CM

## 2020-09-30 DIAGNOSIS — O0992 Supervision of high risk pregnancy, unspecified, second trimester: Secondary | ICD-10-CM

## 2020-09-30 NOTE — Progress Notes (Signed)
  Subjective  Fetal Movement? Early flutters Contractions? no Leaking Fluid? no Vaginal Bleeding? no Nausea? no Objective  BP 100/70   Wt 164 lb (74.4 kg)   LMP 06/15/2020 (Exact Date)   BMI 30.00 kg/m  General: NAD Pumonary: no increased work of breathing Abdomen: gravid, non-tender Extremities: no edema Psychiatric: mood appropriate, affect full  Assessment  29 y.o. G2P1001 at [redacted]w[redacted]d by  03/22/2021, by Last Menstrual Period presenting for routine prenatal visit  Plan   Problem List Items Addressed This Visit      Other   S/P cesarean section   High-risk pregnancy, first trimester    Other Visit Diagnoses    [redacted] weeks gestation of pregnancy    -  Primary   Relevant Orders   US OB Comp + 14 Wk    PNV, [place] nv    Annamarie Major, MD, [facility], [facility] 09/30/2020  3:24 PM

## 2020-09-30 NOTE — Patient Instructions (Signed)

## 2020-10-28 ENCOUNTER — Encounter: Payer: Self-pay | Admitting: Obstetrics and Gynecology

## 2020-10-28 ENCOUNTER — Other Ambulatory Visit: Payer: Self-pay

## 2020-10-28 ENCOUNTER — Ambulatory Visit (INDEPENDENT_AMBULATORY_CARE_PROVIDER_SITE_OTHER): Payer: No Typology Code available for payment source | Admitting: Obstetrics and Gynecology

## 2020-10-28 ENCOUNTER — Ambulatory Visit (INDEPENDENT_AMBULATORY_CARE_PROVIDER_SITE_OTHER): Payer: No Typology Code available for payment source

## 2020-10-28 VITALS — BP 114/70 | Wt 167.0 lb

## 2020-10-28 DIAGNOSIS — Z3A15 15 weeks gestation of pregnancy: Secondary | ICD-10-CM | POA: Diagnosis not present

## 2020-10-28 DIAGNOSIS — O0992 Supervision of high risk pregnancy, unspecified, second trimester: Secondary | ICD-10-CM

## 2020-10-28 DIAGNOSIS — Z3A19 19 weeks gestation of pregnancy: Secondary | ICD-10-CM

## 2020-10-28 DIAGNOSIS — Z98891 History of uterine scar from previous surgery: Secondary | ICD-10-CM

## 2020-10-28 NOTE — Progress Notes (Signed)
Routine Prenatal Care Visit  Subjective  Alicia [name] is a 29 y.o. G2P1001 at [redacted]w[redacted]d being seen today for ongoing prenatal care.  She is currently monitored for the following issues for this high-risk pregnancy and has Family history of genetic disease carrier; S/P cesarean section; and Supervision of high risk pregnancy, antepartum on their problem list.  ----------------------------------------------------------------------------------- Patient reports no complaints.    . Vag. Bleeding: None.  Movement: Present. Leaking Fluid denies.  Anatomy u/s incomplete for RVOT ----------------------------------------------------------------------------------- The following portions of the patient's history were reviewed and updated as appropriate: allergies, current medications, past family history, past medical history, past social history, past surgical history and problem list. Problem list updated.  Objective  Blood pressure 114/70, weight 167 lb (75.8 kg), last menstrual period 06/15/2020, unknown if currently breastfeeding. Pregravid weight 159 lb (72.1 kg) Total Weight Gain 8 lb (3.629 kg) Urinalysis: Urine Protein    Urine Glucose    Fetal Status: Fetal Heart Rate (bpm): 144   Movement: Present     General:  Alert, oriented and cooperative. Patient is in no acute distress.  Skin: Skin is warm and dry. No rash noted.   Cardiovascular: Normal heart rate noted  Respiratory: Normal respiratory effort, no problems with respiration noted  Abdomen: Soft, gravid, appropriate for gestational age. Pain/Pressure: Absent     Pelvic:  Cervical exam deferred        Extremities: Normal range of motion.  Edema: None  Mental Status: Normal mood and affect. Normal behavior. Normal judgment and thought content.   Imaging Results US OB Comp + 14 Wk  Result Date: 10/28/2020 Patient Name: Alicia [name] DOB: 1990-12-05 MRN: [id number] ULTRASOUND REPORT Location: [facility] Date of Service:  10/28/2020 Indications:Anatomy Ultrasound Findings: Mason Jim intrauterine pregnancy is visualized with FHR at 144 BPM. Biometrics give an (U/S) Gestational age of [redacted]w[redacted]d and an (U/S) EDD of 03/25/2021; this correlates with the clinically established Estimated Date of Delivery: 03/22/21 Fetal presentation is Cephalic. EFW: 285 g ( 10 oz ). Placenta: Posterior Fundal . Grade: 1 AFI: subjectively normal. Anatomic survey is incomplete for RVOT and otherwise normal; Gender - female.  Impression: 1. [redacted]w[redacted]d Viable Singleton Intrauterine pregnancy by U/S. 2. (U/S) EDD is consistent with Clinically established Estimated Date of Delivery: 03/22/21 , [redacted]w[redacted]d 3. Normal Anatomy Scan Recommendations: 1.Clinical correlation with the patient's History and Physical Exam. 2. Follow up anatomy scan for RVOT Elyse S [place], RT There is a singleton gestation with subjectively normal amniotic fluid volume. The fetal biometry correlates with established dating. Detailed evaluation of the fetal anatomy was performed.The fetal anatomical survey appears within normal limits within the resolution of ultrasound as described above.  Not all structures were able to be visualized on today's study.  It is recommended that a follow-up ultrasound be performed to complete visualization of the unobserved structures today.  It must be noted that a normal ultrasound is unable to rule out fetal aneuploidy nor is it able to detect all possible malformations.    The ultrasound images and findings were reviewed by me and I agree with the above report. Thomasene Mohair, MD, [facility], [facility] 10/28/2020 3:24 PM       Assessment   29 y.o. G2P1001 at [redacted]w[redacted]d by  03/22/2021, by Last Menstrual Period presenting for routine prenatal visit  Plan   pregnancy2 Problems (from 06/15/20 to present)    Problem Noted Resolved   Supervision of high risk pregnancy, antepartum 07/31/2020 by Nadara Mustard,  MD No   Overview Addendum  08/19/2020  4:47 PM by Nadara Mustard, MD    Clinic [facility] Prenatal Labs  Dating LMP, confirmed w [place] 9 wks Blood type:     Genetic Screen NIPS: Antibody:   Anatomic [place]  Rubella: 23.20 (09/16 0738) Varicella: IMMUNE  GTT Third trimester:  RPR: NON REACTIVE (09/16 0738)   Rhogam n/a HBsAg: NON REACTIVE (09/16 0738)   TDaP vaccine         Flu Shot:work, 10/21 HIV:     Baby Food                                GBS:   Contraception  Pap:07/31/2020  CBB  No   CS/VBAC Repeat desired, PH   Support Person Mardelle Matte           Previous Version       Preterm labor symptoms and general obstetric precautions including but not limited to vaginal bleeding, contractions, leaking of fluid and fetal movement were reviewed in detail with the patient. Please refer to After Visit Summary for other counseling recommendations.   - Discussed TOLAC some today. VBAC consent provided.   Return in about 3 weeks (around 11/18/2020) for U/S for completion anatomy and Routine Prenatal Appointment.   Thomasene Mohair, MD, [facility], [facility] 10/28/2020 3:49 PM

## 2020-11-18 ENCOUNTER — Ambulatory Visit (INDEPENDENT_AMBULATORY_CARE_PROVIDER_SITE_OTHER): Payer: No Typology Code available for payment source | Admitting: Obstetrics & Gynecology

## 2020-11-18 ENCOUNTER — Other Ambulatory Visit: Payer: Self-pay | Admitting: Obstetrics & Gynecology

## 2020-11-18 ENCOUNTER — Ambulatory Visit (INDEPENDENT_AMBULATORY_CARE_PROVIDER_SITE_OTHER): Payer: No Typology Code available for payment source

## 2020-11-18 ENCOUNTER — Other Ambulatory Visit: Payer: Self-pay

## 2020-11-18 ENCOUNTER — Encounter: Payer: Self-pay | Admitting: Obstetrics & Gynecology

## 2020-11-18 VITALS — BP 100/60 | Wt 169.0 lb

## 2020-11-18 DIAGNOSIS — O0990 Supervision of high risk pregnancy, unspecified, unspecified trimester: Secondary | ICD-10-CM

## 2020-11-18 DIAGNOSIS — Z3689 Encounter for other specified antenatal screening: Secondary | ICD-10-CM

## 2020-11-18 DIAGNOSIS — Z98891 History of uterine scar from previous surgery: Secondary | ICD-10-CM

## 2020-11-18 DIAGNOSIS — O0992 Supervision of high risk pregnancy, unspecified, second trimester: Secondary | ICD-10-CM

## 2020-11-18 DIAGNOSIS — Z3A22 22 weeks gestation of pregnancy: Secondary | ICD-10-CM

## 2020-11-18 DIAGNOSIS — Z131 Encounter for screening for diabetes mellitus: Secondary | ICD-10-CM

## 2020-11-18 NOTE — Progress Notes (Signed)
  Subjective  Fetal Movement? yes Contractions? no Leaking Fluid? no Vaginal Bleeding? no  Review of ULTRASOUND. I have personally reviewed images and report of recent ultrasound done at [facility] for anatomy scan completion. There is a singleton gestation with subjectively normal amniotic fluid volume. The fetal biometry correlates with established dating. Detailed evaluation of the fetal anatomy was performed.The fetal anatomical survey appears within normal limits within the resolution of ultrasound as described above.  It must be noted that a normal ultrasound is unable to rule out fetal aneuploidy.    Objective  BP 100/60   Wt 169 lb (76.7 kg)   LMP 06/15/2020 (Exact Date)   BMI 30.91 kg/m  General: NAD Pumonary: no increased work of breathing Abdomen: gravid, non-tender Extremities: no edema Psychiatric: mood appropriate, affect full  Assessment  30 y.o. G2P1001 at [redacted]w[redacted]d by  03/22/2021, by Last Menstrual Period presenting for routine prenatal visit  Plan   Problem List Items Addressed This Visit      Other   S/P cesarean section   Supervision of high risk pregnancy, antepartum    Other Visit Diagnoses    [redacted] weeks gestation of pregnancy    -  Primary   Supervision of high risk pregnancy in second trimester       Screening for diabetes mellitus       Relevant Orders   28 Weeks RH-Panel      pregnancy2 Problems (from 06/15/20 to present)    Problem Noted Resolved   Supervision of high risk pregnancy, antepartum 07/31/2020 by Nadara Mustard, MD No   Overview Addendum 11/18/2020  2:55 PM by Nadara Mustard, MD    Clinic [facility] Prenatal Labs  Dating LMP, confirmed w [place] 9 wks Blood type: A/Negative/-- (10/18 1638)   Genetic Screen NIPS:nml XX Antibody:Negative (10/18 1638)  Anatomic [place] [facility] x2, nml Rubella: 26.30 (10/18 1638) Varicella: IMMUNE  GTT Third trimester:  RPR: Non Reactive (10/18 1638)   Rhogam n/a HBsAg: Negative (10/18 1638)   TDaP vaccine         Flu  Shot:work, 10/21 HIV: Non Reactive (10/18 1638)   Baby Food Breast       GBS:   Contraception  Pap:07/31/2020  CBB  No   CS/VBAC Repeat desired although now considering VBAC   Support Person Mardelle Matte    PNV Glucola nv CS and VBAC discussed Vit support discussed       Annamarie Major, MD, [facility], [facility] 11/18/2020  3:17 PM

## 2020-11-18 NOTE — Patient Instructions (Signed)
Thank you for choosing [facility]. As part of our ongoing efforts to improve patient experience, we would appreciate your feedback. Please fill out the short survey that you will receive by mail or MyChart. Your opinion is important to us! -Dr Kalesha Irving  Recommendations to boost your immunity to prevent illness such as viral flu and colds, including covid19, are as follows:       - - -  Vitamin K2 and Vitamin D3  - - - Take Vitamin K2 at 200-300 mcg daily (usually 2-3 pills daily of the over the counter formulation). Take Vitamin D3 at 3000-4000 U daily (usually 3-4 pills daily of the over the counter formulation). Studies show that these two at high normal levels in your system are very effective in keeping your immunity so strong and protective that you will be unlikely to contract viral illness such as those listed above.  Dr Sahory Nordling  

## 2020-12-12 ENCOUNTER — Other Ambulatory Visit: Payer: Self-pay

## 2020-12-12 ENCOUNTER — Telehealth: Payer: Self-pay

## 2020-12-12 ENCOUNTER — Ambulatory Visit (INDEPENDENT_AMBULATORY_CARE_PROVIDER_SITE_OTHER): Payer: No Typology Code available for payment source | Admitting: Obstetrics

## 2020-12-12 VITALS — BP 120/70 | Wt 172.0 lb

## 2020-12-12 DIAGNOSIS — F32A Depression, unspecified: Secondary | ICD-10-CM

## 2020-12-12 DIAGNOSIS — O99340 Other mental disorders complicating pregnancy, unspecified trimester: Secondary | ICD-10-CM

## 2020-12-12 DIAGNOSIS — F329 Major depressive disorder, single episode, unspecified: Secondary | ICD-10-CM

## 2020-12-12 DIAGNOSIS — O99342 Other mental disorders complicating pregnancy, second trimester: Secondary | ICD-10-CM

## 2020-12-12 DIAGNOSIS — O0992 Supervision of high risk pregnancy, unspecified, second trimester: Secondary | ICD-10-CM

## 2020-12-12 MED ORDER — SERTRALINE HCL 50 MG PO TABS: 50.0000 mg | ORAL_TABLET | Freq: Every day | ORAL | 6 refills | Status: DC

## 2020-12-12 NOTE — Telephone Encounter (Signed)
Pt left triage msg saying she has extreme depression, insomnia, cannot stop crying. Has appt 12/16/20 [facility], doesn't think she can wait till then. Please advise.

## 2020-12-12 NOTE — Progress Notes (Signed)
Pt is having extreme depression, insomnia

## 2020-12-12 NOTE — Progress Notes (Signed)
Problem visit:She presents for a problem visit. Crying and tearful- cannot stop. Has multiple family stressors ( both her mom and sister are Fentanyl abusers), her husband has PTSD, and work is very stressful Works at [employer] as a Designer, industrial/product. Has not been sleeping, and cries at work often.  Not able to concentrate FHTS 150s, abdomen is soft. Lungs CTA bilaterally VS- WNL   PHQ score-12 GAD- 13  A: MDD during perinatal period P Discussed best combination of counseling, exercise and medication to address her symptoms. Will start on trial of Zoloft 50 mg. Discussed the use and side effects of Sertraline with her. She has an ROB in 5 days. Will f/u for medication in 2 weeks ta her 28 week ROB.  Mirna Mires, CNM  12/12/2020 3:15 PM

## 2020-12-12 NOTE — Telephone Encounter (Signed)
OK to work in prior to 1/31 w any OB openings

## 2020-12-16 ENCOUNTER — Encounter: Payer: Self-pay | Admitting: Obstetrics & Gynecology

## 2020-12-16 ENCOUNTER — Ambulatory Visit (INDEPENDENT_AMBULATORY_CARE_PROVIDER_SITE_OTHER): Payer: No Typology Code available for payment source | Admitting: Obstetrics & Gynecology

## 2020-12-16 ENCOUNTER — Other Ambulatory Visit: Payer: No Typology Code available for payment source

## 2020-12-16 ENCOUNTER — Encounter: Payer: No Typology Code available for payment source | Admitting: Obstetrics & Gynecology

## 2020-12-16 ENCOUNTER — Other Ambulatory Visit: Payer: Self-pay

## 2020-12-16 VITALS — BP 120/70 | Wt 167.0 lb

## 2020-12-16 DIAGNOSIS — Z98891 History of uterine scar from previous surgery: Secondary | ICD-10-CM

## 2020-12-16 DIAGNOSIS — Z131 Encounter for screening for diabetes mellitus: Secondary | ICD-10-CM

## 2020-12-16 DIAGNOSIS — Z3A26 26 weeks gestation of pregnancy: Secondary | ICD-10-CM

## 2020-12-16 DIAGNOSIS — O0990 Supervision of high risk pregnancy, unspecified, unspecified trimester: Secondary | ICD-10-CM

## 2020-12-16 DIAGNOSIS — O0992 Supervision of high risk pregnancy, unspecified, second trimester: Secondary | ICD-10-CM

## 2020-12-16 NOTE — Patient Instructions (Signed)
Thank you for choosing [facility]. As part of our ongoing efforts to improve patient experience, we would appreciate your feedback. Please fill out the short survey that you will receive by mail or MyChart. Your opinion is important to [place]! -Dr Tiburcio Pea  Perinatal Depression When a woman feels excessive sadness, anger, or anxiety during pregnancy or during the first 12 months after she gives birth, she has a condition called perinatal depression. This can interfere with work, school, relationships, and other everyday activities. If it is not managed properly, it can also interfere with the woman's ability to take care of the baby. Symptoms of perinatal depression may feel worse when living with a newborn. Sometimes, these symptoms are left untreated because they are thought to be normal mood swings during and right after pregnancy. However, if you have intense symptoms of depression that last for more than 2 weeks, it is important to talk with your health care provider. This may be perinatal depression. What are the causes? The exact cause of this condition is not known. Hormonal changes during and after pregnancy may play a role in causing perinatal depression. What increases the risk? You are more likely to develop this condition if:  You have a personal or family history of depression, anxiety, or mood disorders.  You experience a stressful life event during pregnancy, such as the death of a loved one.  You have additional life stress, such as being a single parent.  You do not have support from family members or loved ones, or you are in an abusive relationship.  You have thyroid problems. What are the signs or symptoms? Symptoms of this condition include:  Emotional symptoms, such as: ? Feeling sad or hopeless. ? Feelings of guilt. ? Feeling irritable or overwhelmed.  Physical symptoms, such as: ? Changes with appetite or sleep. ? Lack of energy or motivation. ? Persistent  headaches or stomach problems.  Behavioral symptoms, such as: ? Difficulty concentrating or completing tasks. ? Loss of interest in hobbies or relationships. How is this diagnosed? This condition is diagnosed based on a physical exam and mental evaluation.  In some cases, your health care provider may use a depression screening tool. This includes a list of questions that can help a health care provider diagnose depression.  You may be referred to a mental health expert who specializes in treating perinatal depression. How is this treated? This condition may be treated with:  Talk therapy with a mental health professional. This may be interpersonal psychotherapy, couples therapy, cognitive behavioral therapy, or mother-child bonding therapy.  Medicines. Your health care provider will discuss the safety of the medicines prescribed during pregnancy and breastfeeding.  Support groups.  Brain stimulation or light therapies.  Stress reduction therapies, such as mindfulness.   Follow these instructions at home: Lifestyle  Do not use any products that contain nicotine or tobacco. These products include cigarettes, chewing tobacco, and vaping devices, such as e-cigarettes. If you need help quitting, ask your health care provider.  Do not drink alcohol when you are pregnant. It is also safest not to drink alcohol if you are breastfeeding.  After your baby is born, if you drink alcohol: ? Limit how much you have to 0-1 drink a day. ? Be aware of how much alcohol is in your drink. In the [place] one drink equals one 12 oz bottle of beer (355 mL), one 5 oz glass of wine (148 mL), or one 1 oz glass of hard liquor (44 mL).  Consider joining a support group for new mothers. Ask your health care provider for recommendations.  Take good care of yourself. Make sure you: ? Get as much sleep as possible. Talk with your partner about sharing the responsibility of getting up with your baby if possible  and sharing child care responsibilities equally. Make sleep a priority. ? Eat a healthy diet. This includes plenty of fruits and vegetables, whole grains, and lean proteins. ? Exercise regularly, as told by your health care provider. Ask your health care provider what exercises are safe for you. Talk with your partner about making sure you both have opportunities to exercise. General instructions  Take over-the-counter and prescription medicines only as told by your health care provider.  Talk with your partner or family members about your feelings during pregnancy. Share any concerns, needs, or anxieties that you may have. Do not be afraid to ask for help. Find a mental health professional, if needed.  Ask for help with tasks or chores when you need it. Ask friends and family members to provide meals, watch your children, or help with cleaning.  Keep all follow-up visits. This is important. Contact a health care provider if:  You or people close to you notice that you have symptoms of depression.  Your symptoms of depression get worse.  You take medicines and have side effects, such as nausea or sleep problems. Get help right away if:  You feel like hurting yourself, your baby, or someone else. If you feel like you may hurt yourself or others, or have thoughts about taking your own life, get help right away. You can go to your nearest emergency department or:  Call your local emergency services (911 in the [place]  Call a suicide crisis helpline, such as the National Suicide Prevention Lifeline, at 5131425730. This is open 24 hours a day in the [place]  Text the Crisis Text Line at (249)384-2052 (in the [place] Summary  Perinatal depression is when a woman feels excessive sadness, anger, or anxiety during pregnancy or during the first 12 months after she gives birth.  If perinatal depression is not managed properly, it can interfere with the woman's ability to take care of the baby.  This  condition is treated with medicines, talk therapy, stress reduction therapies, or a combination of treatments.  Talk with your partner or family members about your feelings. Ask for help when you need it. This information is not intended to replace advice given to you by your health care provider. Make sure you discuss any questions you have with your health care provider. Document Revised: 04/26/2020 Document Reviewed: 04/26/2020 Elsevier Patient Education  2021 [employer].

## 2020-12-16 NOTE — Progress Notes (Signed)
  Subjective  Fetal Movement? yes Contractions? no Leaking Fluid? no Vaginal Bleeding? no Depression a concern w stressors noted; stared Zoloft on Friday last week    Denies SI, HI, dysfunction    Worried about PPD Objective  BP 120/70   Wt 167 lb (75.8 kg)   LMP 06/15/2020 (Exact Date)   BMI 30.54 kg/m  General: NAD Pumonary: no increased work of breathing Abdomen: gravid, non-tender Extremities: no edema Psychiatric: mood appropriate, affect full  Assessment  30 y.o. G2P1001 at [redacted]w[redacted]d by  03/22/2021, by Last Menstrual Period presenting for routine prenatal visit  Plan   Problem List Items Addressed This Visit      Other   S/P cesarean section    Other Visit Diagnoses    [redacted] weeks gestation of pregnancy    -  Primary   Supervision of high risk pregnancy in second trimester          pregnancy2 Problems (from 06/15/20 to present)    Problem Noted Resolved   Supervision of high risk pregnancy, antepartum 07/31/2020 by Nadara Mustard, MD No   Overview Addendum 11/18/2020  2:55 PM by Nadara Mustard, MD    Clinic [facility] Prenatal Labs  Dating LMP, confirmed w [place] 9 wks Blood type: A/Negative/-- (10/18 1638)   Genetic Screen NIPS:nml XX Antibody:Negative (10/18 1638)  Anatomic [place] [facility] x2, nml Rubella: 26.30 (10/18 1638) Varicella: IMMUNE  GTT Third trimester:  RPR: Non Reactive (10/18 1638)   Rhogam n/a HBsAg: Negative (10/18 1638)   TDaP vaccine         Flu Shot:work, 10/21 HIV: Non Reactive (10/18 1638)   Baby Food Bottle GBS:   Contraception OCP Pap:07/31/2020  CBB  No   CS/VBAC Repeat desired, PH   Support Person Mardelle Matte           Previous Version     Plan to cont Zoloft into the PP time frame  Monitor for side effects or need for dose/,med change  PNV  Glucola today  Plans to bottle feed  Plans OCPs  Considering options of CS or VBAC  Annamarie Major, MD, [facility], [facility] 12/16/2020  3:37 PM

## 2020-12-17 LAB — 28 WEEKS RH-PANEL
Antibody Screen: NEGATIVE
Basophils Absolute: 0 10*3/uL (ref 0.0–0.2)
Basos: 0 %
EOS (ABSOLUTE): 0.1 10*3/uL (ref 0.0–0.4)
Eos: 1 %
Gestational Diabetes Screen: 115 mg/dL (ref 65–139)
HIV Screen 4th Generation wRfx: NONREACTIVE
Hematocrit: 32.4 % — ABNORMAL LOW (ref 34.0–46.6)
Hemoglobin: 11 g/dL — ABNORMAL LOW (ref 11.1–15.9)
Immature Grans (Abs): 0.1 10*3/uL (ref 0.0–0.1)
Immature Granulocytes: 1 %
Lymphocytes Absolute: 1.7 10*3/uL (ref 0.7–3.1)
Lymphs: 17 %
MCH: 30.8 pg (ref 26.6–33.0)
MCHC: 34 g/dL (ref 31.5–35.7)
MCV: 91 fL (ref 79–97)
Monocytes Absolute: 0.6 10*3/uL (ref 0.1–0.9)
Monocytes: 6 %
Neutrophils Absolute: 7.5 10*3/uL — ABNORMAL HIGH (ref 1.4–7.0)
Neutrophils: 75 %
Platelets: 223 10*3/uL (ref 150–450)
RBC: 3.57 x10E6/uL — ABNORMAL LOW (ref 3.77–5.28)
RDW: 12.3 % (ref 11.7–15.4)
RPR Ser Ql: NONREACTIVE
WBC: 10 10*3/uL (ref 3.4–10.8)

## 2020-12-30 ENCOUNTER — Ambulatory Visit (INDEPENDENT_AMBULATORY_CARE_PROVIDER_SITE_OTHER): Payer: No Typology Code available for payment source | Admitting: Obstetrics & Gynecology

## 2020-12-30 ENCOUNTER — Other Ambulatory Visit: Payer: Self-pay

## 2020-12-30 ENCOUNTER — Encounter: Payer: Self-pay | Admitting: Obstetrics & Gynecology

## 2020-12-30 VITALS — BP 120/70 | Wt 168.0 lb

## 2020-12-30 DIAGNOSIS — O26892 Other specified pregnancy related conditions, second trimester: Secondary | ICD-10-CM | POA: Diagnosis not present

## 2020-12-30 DIAGNOSIS — O0992 Supervision of high risk pregnancy, unspecified, second trimester: Secondary | ICD-10-CM

## 2020-12-30 DIAGNOSIS — Z6791 Unspecified blood type, Rh negative: Secondary | ICD-10-CM

## 2020-12-30 DIAGNOSIS — Z3A28 28 weeks gestation of pregnancy: Secondary | ICD-10-CM | POA: Diagnosis not present

## 2020-12-30 DIAGNOSIS — Z98891 History of uterine scar from previous surgery: Secondary | ICD-10-CM

## 2020-12-30 MED ORDER — RHO D IMMUNE GLOBULIN 1500 UNIT/2ML IJ SOSY
300.0000 ug | PREFILLED_SYRINGE | Freq: Once | INTRAMUSCULAR | Status: AC
  Administered 2020-12-30: 300 ug via INTRAMUSCULAR

## 2020-12-30 NOTE — Progress Notes (Signed)
  Subjective  Fetal Movement? yes Contractions? no Leaking Fluid? no Vaginal Bleeding? no  Objective  BP 120/70   Wt 168 lb (76.2 kg)   LMP 06/15/2020 (Exact Date)   BMI 30.73 kg/m  General: NAD Pumonary: no increased work of breathing Abdomen: gravid, non-tender Extremities: no edema Psychiatric: mood appropriate, affect full  Assessment  30 y.o. G2P1001 at [redacted]w[redacted]d by  03/22/2021, by Last Menstrual Period presenting for routine prenatal visit  Plan   Problem List Items Addressed This Visit   None     pregnancy2 Problems (from 06/15/20 to present)    Problem Noted Resolved   Supervision of high risk pregnancy, antepartum 07/31/2020 by Nadara Mustard, MD No   Overview Addendum 12/16/2020  3:39 PM by Nadara Mustard, MD    Clinic [facility] Prenatal Labs  Dating LMP, confirmed w [place] 9 wks Blood type: A/Negative/-- (10/18 1638)   Genetic Screen NIPS:nml XX Antibody:Negative (10/18 1638)  Anatomic [place] [facility] x2, nml Rubella: 26.30 (10/18 1638) Varicella: IMMUNE  GTT Third trimester:  RPR: Non Reactive (10/18 1638)   Rhogam n/a HBsAg: Negative (10/18 1638)   TDaP vaccine         Flu Shot:work, 10/21 HIV: Non Reactive (10/18 1638)   Baby Food Bottle  GBS:   Contraception OCP Pap:07/31/2020  CBB  No   CS/VBAC Repeat desired, PH   Support Person Andy           PNV, [facility]  Rhogam today  CS sch for 03/18/21 (R CS)  OCP for pp bc  Annamarie Major, MD, FACOG [facility], [facility] 12/30/2020  3:38 PM

## 2020-12-30 NOTE — Patient Instructions (Signed)

## 2021-01-08 ENCOUNTER — Telehealth: Payer: Self-pay

## 2021-01-08 NOTE — Telephone Encounter (Signed)
Scheduled Cesarean section w Alicia [name]  DOS 03/18/21  H&P 4/26 @ 2:50 pm   Covid testing 5/2 @ 9-10 AM, [address], drive up and wear mask. Advised pt to quarantine until DOS.  Pre-admit phone call appointment to be requested - date and time will be included on H&P paper work. Also all appointments will be updated on pt MyChart. Explained that this appointment has a call window. Based on the time scheduled will indicate if the call will be received within a 4 hour window before 1:00 or after.  Advised that pt may also receive calls from the hospital pharmacy and pre-service center.  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 No secondary insurance.

## 2021-01-08 NOTE — Telephone Encounter (Signed)
-----   Message from Nadara Mustard, MD sent at 12/30/2020  3:46 PM EST ----- Regarding: surgery Surgery Booking Request Patient Full Name:  Alicia [name]  MRN: [id number]  DOB: 07-21-1991  Surgeon: Letitia Libra, MD  Requested Surgery Date and Time: 03/18/2021 Primary Diagnosis AND Code: Prior CS, 39 weeks Secondary Diagnosis and Code:  Surgical Procedure: Cesarean Section RNFA Requested?: No L&D Notification: Yes Admission Status: surgery admit Length of Surgery: 50 min Special Case Needs: No H&P: Yes Phone Interview???:  Yes Interpreter: No Medical Clearance:  No Special Scheduling Instructions: No Any known health/anesthesia issues, diabetes, sleep apnea, latex allergy, defibrillator/pacemaker?: No Acuity: P1   (P1 highest, P2 delay may cause harm, P3 low, elective gyn, P4 lowest)

## 2021-01-13 ENCOUNTER — Encounter: Payer: Self-pay | Admitting: Obstetrics & Gynecology

## 2021-01-13 ENCOUNTER — Ambulatory Visit (INDEPENDENT_AMBULATORY_CARE_PROVIDER_SITE_OTHER): Payer: No Typology Code available for payment source | Admitting: Obstetrics & Gynecology

## 2021-01-13 ENCOUNTER — Other Ambulatory Visit: Payer: Self-pay

## 2021-01-13 VITALS — BP 120/80 | Wt 173.0 lb

## 2021-01-13 DIAGNOSIS — Z98891 History of uterine scar from previous surgery: Secondary | ICD-10-CM

## 2021-01-13 DIAGNOSIS — Z23 Encounter for immunization: Secondary | ICD-10-CM

## 2021-01-13 DIAGNOSIS — O0993 Supervision of high risk pregnancy, unspecified, third trimester: Secondary | ICD-10-CM

## 2021-01-13 DIAGNOSIS — Z3A30 30 weeks gestation of pregnancy: Secondary | ICD-10-CM

## 2021-01-13 DIAGNOSIS — O0990 Supervision of high risk pregnancy, unspecified, unspecified trimester: Secondary | ICD-10-CM

## 2021-01-13 NOTE — Patient Instructions (Signed)
Back Pain in Pregnancy Back pain during pregnancy is common. Back pain may be caused by several factors that are related to changes during your pregnancy. Follow these instructions at home: Managing pain, stiffness, and swelling  If directed, for sudden (acute) back pain, put ice on the painful area. ? Put ice in a plastic bag. ? Place a towel between your skin and the bag. ? Leave the ice on for 20 minutes, 2-3 times per day.  If directed, apply heat to the affected area before you exercise. Use the heat source that your health care provider recommends, such as a moist heat pack or a heating pad. ? Place a towel between your skin and the heat source. ? Leave the heat on for 20-30 minutes. ? Remove the heat if your skin turns bright red. This is especially important if you are unable to feel pain, heat, or cold. You may have a greater risk of getting burned.  If directed, massage the affected area.      Activity  Exercise as told by your health care provider. Gentle exercise is the best way to prevent or manage back pain.  Listen to your body when lifting. If lifting hurts, ask for help or bend your knees. This uses your leg muscles instead of your back muscles.  Squat down when picking up something from the floor. Do not bend over.  Only use bed rest for short periods as told by your health care provider. Bed rest should only be used for the most severe episodes of back pain. Standing, sitting, and lying down  Do not stand in one place for long periods of time.  Use good posture when sitting. Make sure your head rests over your shoulders and is not hanging forward. Use a pillow on your lower back if necessary.  Try sleeping on your side, preferably the left side, with a pregnancy support pillow or 1-2 regular pillows between your legs. ? If you have back pain after a night's rest, your bed may be too soft. ? A firm mattress may provide more support for your back during  pregnancy. General instructions  Do not wear high heels.  Eat a healthy diet. Try to gain weight within your health care provider's recommendations.  Use a maternity girdle, elastic sling, or back brace as told by your health care provider.  Take over-the-counter and prescription medicines only as told by your health care provider.  Work with a physical therapist or massage therapist to find ways to manage back pain. Acupuncture or massage therapy may be helpful.  Keep all follow-up visits as told by your health care provider. This is important. Contact a health care provider if:  Your back pain interferes with your daily activities.  You have increasing pain in other parts of your body. Get help right away if:  You develop numbness, tingling, weakness, or problems with the use of your arms or legs.  You develop severe back pain that is not controlled with medicine.  You have a change in bowel or bladder control.  You develop shortness of breath, dizziness, or you faint.  You develop nausea, vomiting, or sweating.  You have back pain that is a rhythmic, cramping pain similar to labor pains. Labor pain is usually 1-2 minutes apart, lasts for about 1 minute, and involves a bearing down feeling or pressure in your pelvis.  You have back pain and your water breaks or you have vaginal bleeding.  You have back pain or   numbness that travels down your leg.  Your back pain developed after you fell.  You develop pain on one side of your back.  You see blood in your urine.  You develop skin blisters in the area of your back pain. Summary  Back pain may be caused by several factors that are related to changes during your pregnancy.  Follow instructions as told by your health care provider for managing pain, stiffness, and swelling.  Exercise as told by your health care provider. Gentle exercise is the best way to prevent or manage back pain.  Take over-the-counter and  prescription medicines only as told by your health care provider.  Keep all follow-up visits as told by your health care provider. This is important. This information is not intended to replace advice given to you by your health care provider. Make sure you discuss any questions you have with your health care provider. Document Revised: 02/21/2019 Document Reviewed: 04/20/2018 Elsevier Patient Education  2021 Elsevier Inc.  

## 2021-01-13 NOTE — Progress Notes (Signed)
  Subjective  Fetal Movement? yes Contractions? no Leaking Fluid? no Vaginal Bleeding? No Min edema  Objective  BP 120/80   Wt 173 lb (78.5 kg)   LMP 06/15/2020 (Exact Date)   BMI 31.64 kg/m  General: NAD Pumonary: no increased work of breathing Abdomen: gravid, non-tender Extremities: no edema Psychiatric: mood appropriate, affect full  Assessment  30 y.o. G2P1001 at [redacted]w[redacted]d by  03/22/2021, by Last Menstrual Period presenting for routine prenatal visit  Plan   Problem List Items Addressed This Visit      Other   S/P cesarean section   Supervision of high risk pregnancy, antepartum    Other Visit Diagnoses    [redacted] weeks gestation of pregnancy    -  Primary   Supervision of high risk pregnancy in third trimester        PNV, [facility] TDaP today PTL precautions discussed Plans CS 03/18/21 OCP for BC Bottle feeding plans  pregnancy2 Problems (from 06/15/20 to present)    Problem Noted Resolved   Supervision of high risk pregnancy, antepartum 07/31/2020 by Nadara Mustard, MD No   Overview Addendum 01/13/2021  4:18 PM by Nadara Mustard, MD    Clinic [facility] Prenatal Labs  Dating LMP, confirmed w [place] 9 wks Blood type: A/Negative/-- (10/18 1638)   Genetic Screen NIPS:nml XX Antibody:Negative (10/18 1638)  Anatomic [place] [facility] x2, nml Rubella: 26.30 (10/18 1638) Varicella: IMMUNE  GTT Third trimester:  RPR: Non Reactive (10/18 1638)   Rhogam n/a HBsAg: Negative (10/18 1638)   TDaP vaccine  01/13/21 Flu Shot:work, 10/21 HIV: Non Reactive (10/18 1638)   Baby Food Bottle  GBS:   Contraception OCP Pap:07/31/2020  CBB  No   CS/VBAC Repeat desired, PH   Support Person Jackie Plum, MD, [facility], [facility] 01/13/2021  4:19 PM

## 2021-01-27 ENCOUNTER — Ambulatory Visit (INDEPENDENT_AMBULATORY_CARE_PROVIDER_SITE_OTHER): Payer: No Typology Code available for payment source | Admitting: Obstetrics & Gynecology

## 2021-01-27 ENCOUNTER — Encounter: Payer: Self-pay | Admitting: Obstetrics & Gynecology

## 2021-01-27 ENCOUNTER — Other Ambulatory Visit: Payer: Self-pay

## 2021-01-27 DIAGNOSIS — O0993 Supervision of high risk pregnancy, unspecified, third trimester: Secondary | ICD-10-CM

## 2021-01-27 NOTE — Progress Notes (Signed)
  Subjective  Fetal Movement? yes Contractions? no Leaking Fluid? no Vaginal Bleeding? Spotting 2 days ago, resolved  Objective  BP 122/70   Wt 172 lb (78 kg)   LMP 06/15/2020 (Exact Date)   BMI 31.46 kg/m  General: NAD Pumonary: no increased work of breathing Abdomen: gravid, non-tender Extremities: no edema Psychiatric: mood appropriate, affect full  Assessment  30 y.o. G2P1001 at [redacted]w[redacted]d by  03/22/2021, by Last Menstrual Period presenting for routine prenatal visit  Plan   Problem List Items Addressed This Visit   None   Visit Diagnoses    32 week prematurity    -  Primary   Supervision of high risk pregnancy in third trimester          pregnancy2 Problems (from 06/15/20 to present)    Problem Noted Resolved   Supervision of high risk pregnancy, antepartum 07/31/2020 by Nadara Mustard, MD No   Overview Addendum 01/13/2021  4:18 PM by Nadara Mustard, MD    Clinic [facility] Prenatal Labs  Dating LMP, confirmed w [place] 9 wks Blood type: A/Negative/-- (10/18 1638)   Genetic Screen NIPS:nml XX Antibody:Negative (10/18 1638)  Anatomic [place] [facility] x2, nml Rubella: 26.30 (10/18 1638) Varicella: IMMUNE  GTT Third trimester:  RPR: Non Reactive (10/18 1638)   Rhogam n/a HBsAg: Negative (10/18 1638)   TDaP vaccine  01/13/21 Flu Shot:work, 10/21 HIV: Non Reactive (10/18 1638)   Baby Food Bottle  GBS:   Contraception OCP Pap:07/31/2020  CBB  No   CS/VBAC Repeat desired, PH   Support Person Mardelle Matte        PNV, [facility]  Monitor for bleeding  Annamarie Major, MD, [facility], [facility] 01/27/2021  4:03 PM

## 2021-01-31 ENCOUNTER — Telehealth: Payer: Self-pay

## 2021-01-31 NOTE — Telephone Encounter (Signed)
FMLA/DISABILITY form for [employer] filled out, signature obtained and given to BS for processing. 

## 2021-02-10 ENCOUNTER — Encounter: Payer: Self-pay | Admitting: Obstetrics & Gynecology

## 2021-02-10 ENCOUNTER — Ambulatory Visit (INDEPENDENT_AMBULATORY_CARE_PROVIDER_SITE_OTHER): Payer: No Typology Code available for payment source | Admitting: Obstetrics & Gynecology

## 2021-02-10 ENCOUNTER — Other Ambulatory Visit: Payer: Self-pay

## 2021-02-10 VITALS — BP 122/70 | Wt 175.0 lb

## 2021-02-10 DIAGNOSIS — O26892 Other specified pregnancy related conditions, second trimester: Secondary | ICD-10-CM

## 2021-02-10 DIAGNOSIS — Z3A34 34 weeks gestation of pregnancy: Secondary | ICD-10-CM

## 2021-02-10 DIAGNOSIS — Z6791 Unspecified blood type, Rh negative: Secondary | ICD-10-CM

## 2021-02-10 DIAGNOSIS — O0990 Supervision of high risk pregnancy, unspecified, unspecified trimester: Secondary | ICD-10-CM

## 2021-02-10 DIAGNOSIS — O0993 Supervision of high risk pregnancy, unspecified, third trimester: Secondary | ICD-10-CM

## 2021-02-10 DIAGNOSIS — Z98891 History of uterine scar from previous surgery: Secondary | ICD-10-CM

## 2021-02-10 NOTE — Progress Notes (Signed)
  Subjective  Fetal Movement? yes Contractions? no Leaking Fluid? no Vaginal Bleeding? no  Objective  BP 122/70   Wt 175 lb (79.4 kg)   LMP 06/15/2020 (Exact Date)   BMI 32.01 kg/m  General: NAD Pumonary: no increased work of breathing Abdomen: gravid, non-tender Extremities: no edema Psychiatric: mood appropriate, affect full  Assessment  30 y.o. G2P1001 at [redacted]w[redacted]d by  03/22/2021, by Last Menstrual Period presenting for routine prenatal visit  Plan   Problem List Items Addressed This Visit      Other   S/P cesarean section   Supervision of high risk pregnancy, antepartum    Other Visit Diagnoses    [redacted] weeks gestation of pregnancy    -  Primary   Supervision of high risk pregnancy in third trimester       Rh negative state in antepartum period, second trimester          pregnancy2 Problems (from 06/15/20 to present)    Problem Noted Resolved   Supervision of high risk pregnancy, antepartum 07/31/2020 by Nadara Mustard, MD No   Overview Addendum 02/10/2021  3:12 PM by Nadara Mustard, MD    Clinic [facility] Prenatal Labs  Dating LMP, confirmed w [place] 9 wks Blood type: A/Negative/-- (10/18 1638)   Genetic Screen NIPS:nml XX Antibody:Negative (10/18 1638)  Anatomic [place] [facility] x2, nml Rubella: 26.30 (10/18 1638) Varicella: IMMUNE  GTT Third trimester:  RPR: Non Reactive (10/18 1638)   Rhogam 12/2020 HBsAg: Negative (10/18 1638)   TDaP vaccine  01/13/21 Flu Shot:work, 10/21 HIV: Non Reactive (10/18 1638)   Baby Food Bottle  GBS:   Contraception OCP Pap:07/31/2020  CBB  No   CS/VBAC Repeat desired, PH   Support Person Andy       PNV, [facility], PTL precautions GBS nv CS discussed  The following were addressed during this visit:  Breastfeeding Education - Early initiation of breastfeeding  - The importance of exclusive breastfeeding  - Risks of giving your baby anything other than breast milk if you are breastfeeding  - Nonpharmacological pain relief methods for labor  - The  importance of early skin-to-skin contact  - Rooming-in on a 24-hour basis  - Feeding on demand or baby-led feeding  - Frequent feeding to help assure optimal milk production  - Effective positioning and attachment  - Exclusive breastfeeding for the first 6 months  - Individualized Education   32-35 weeks - Birth Plan  - Feeding Plans   Annamarie Major, MD, [facility], [facility] 02/10/2021  3:21 PM

## 2021-02-25 ENCOUNTER — Encounter: Payer: Self-pay | Admitting: Obstetrics & Gynecology

## 2021-02-25 ENCOUNTER — Other Ambulatory Visit: Payer: Self-pay

## 2021-02-25 ENCOUNTER — Ambulatory Visit (INDEPENDENT_AMBULATORY_CARE_PROVIDER_SITE_OTHER): Payer: No Typology Code available for payment source | Admitting: Obstetrics & Gynecology

## 2021-02-25 VITALS — BP 100/60 | Wt 177.0 lb

## 2021-02-25 DIAGNOSIS — O0993 Supervision of high risk pregnancy, unspecified, third trimester: Secondary | ICD-10-CM

## 2021-02-25 DIAGNOSIS — Z98891 History of uterine scar from previous surgery: Secondary | ICD-10-CM

## 2021-02-25 DIAGNOSIS — Z3685 Encounter for antenatal screening for Streptococcus B: Secondary | ICD-10-CM

## 2021-02-25 DIAGNOSIS — Z3A36 36 weeks gestation of pregnancy: Secondary | ICD-10-CM

## 2021-02-25 NOTE — Patient Instructions (Signed)

## 2021-02-25 NOTE — Progress Notes (Signed)
Subjective  Fetal Movement? yes Contractions? no Leaking Fluid? no Vaginal Bleeding? no  Objective  BP 100/60   Wt 177 lb (80.3 kg)   LMP 06/15/2020 (Exact Date)   BMI 32.37 kg/m  General: NAD Pumonary: no increased work of breathing Abdomen: gravid, non-tender Extremities: no edema Psychiatric: mood appropriate, affect full  Assessment  30 y.o. G2P1001 at [redacted]w[redacted]d by  03/22/2021, by Last Menstrual Period presenting for routine prenatal visit  Plan   Problem List Items Addressed This Visit    S/P cesarean section    Plans R CS at 39 weeks   [redacted] weeks gestation of pregnancy    -  PNV, FMC, PTL precautions   Encounter for antenatal screening for Streptococcus B       Relevant Orders   Culture, beta strep (group b only)   Supervision of high risk pregnancy in third trimester          pregnancy2 Problems (from 06/15/20 to present)    Problem Noted Resolved   Supervision of high risk pregnancy, antepartum     Overview Addendum 02/10/2021  3:12 PM by Nadara Mustard, MD    [facility]inic [facility] Prenatal Labs  Dating LMP, confirmed w [place] 9 wks Blood type: A/Negative/-- (10/18 1638)   Genetic Screen NIPS:nml XX Antibody:Negative (10/18 1638)  Anatomic [place] WSOB x2, nml Rubella: 26.30 (10/18 1638) Varicella: IMMUNE  GTT Third trimester:  RPR: Non Reactive (10/18 1638)   Rhogam 12/2020 HBsAg: Negative (10/18 1638)   TDaP vaccine  01/13/21 Flu Shot:work, 10/21 HIV: Non Reactive (10/18 1638)   Baby Food Bottle  GBS:   Contraception OCP Pap:07/31/2020  CBB  No   CS/VBAC Repeat desired, PH   Support Person Andy         S/P cesarean section 08/30/2015 by Nadara Mustard, MD No   Overview Signed 07/31/2020  3:18 PM by Nadara Mustard, MD    OB/GYN  Counseling Note  30 y.o. G2P1001 at [redacted]w[redacted]d with Estimated Date of Delivery: 03/22/21 was seen today in office to discuss trial of labor after cesarean section (TOLAC) versus elective repeat cesarean delivery (ERCD). The following risks were discussed  with the patient.  Discussed again today, still favors CS.  The risks of an attempted VBAC or TOLAC include the following: Risk of failed trial of labor after cesarean (TOLAC) without a vaginal birth after cesarean (VBAC) resulting in repeat cesarean delivery (RCD) in about 20 to 40 percent of women who attempt VBAC.  Risk of rupture of uterus resulting in an emergency cesarean delivery. The risk of uterine rupture may be related in part to the type of uterine incision made during the first cesarean delivery. A previous transverse uterine incision has the lowest risk of rupture (0.2 to 1.5 percent risk). Vertical or T-shaped uterine incisions have a higher risk of uterine rupture (4 to 9 percent risk)The risk of fetal death is very low with both VBAC and elective repeat cesarean delivery (ERCD), but the likelihood of fetal death is higher with VBAC than with ERCD. Maternal death is very rare with either type of delivery.  The risks of an elective repeat cesarean delivery (ERCD) were reviewed with the patient including but not limited to: 12/998 risk of uterine rupture which could have serious consequences, bleeding which may require transfusion; infection which may require antibiotics; injury to bowel, bladder or other surrounding organs (bowel, bladder, ureters); injury to the fetus; need for additional procedures including hysterectomy in the event of a life-threatening  hemorrhage; thromboembolic phenomenon; abnormal placentation; incisional problems; death and other postoperative or anesthesia complications.    Desires R CS 39 weeks w PH          Alicia Major, MD, [facility], [facility] 02/25/2021  2:31 PM

## 2021-03-01 LAB — CULTURE, BETA STREP (GROUP B ONLY): Strep Gp B Culture: NEGATIVE

## 2021-03-07 ENCOUNTER — Encounter: Payer: Self-pay | Admitting: Obstetrics & Gynecology

## 2021-03-07 ENCOUNTER — Other Ambulatory Visit: Payer: Self-pay | Admitting: Obstetrics & Gynecology

## 2021-03-07 ENCOUNTER — Ambulatory Visit (INDEPENDENT_AMBULATORY_CARE_PROVIDER_SITE_OTHER): Payer: No Typology Code available for payment source | Admitting: Obstetrics & Gynecology

## 2021-03-07 ENCOUNTER — Other Ambulatory Visit: Payer: Self-pay

## 2021-03-07 VITALS — BP 110/70 | Wt 178.0 lb

## 2021-03-07 DIAGNOSIS — Z98891 History of uterine scar from previous surgery: Secondary | ICD-10-CM

## 2021-03-07 DIAGNOSIS — O0990 Supervision of high risk pregnancy, unspecified, unspecified trimester: Secondary | ICD-10-CM

## 2021-03-07 DIAGNOSIS — Z3A37 37 weeks gestation of pregnancy: Secondary | ICD-10-CM

## 2021-03-07 DIAGNOSIS — O26892 Other specified pregnancy related conditions, second trimester: Secondary | ICD-10-CM

## 2021-03-07 DIAGNOSIS — Z6791 Unspecified blood type, Rh negative: Secondary | ICD-10-CM

## 2021-03-07 DIAGNOSIS — O0993 Supervision of high risk pregnancy, unspecified, third trimester: Secondary | ICD-10-CM

## 2021-03-07 MED ORDER — BUPIVACAINE 0.25 % ON-Q PUMP DUAL CATH 400 ML: 400.0000 mL | INJECTION | Status: DC

## 2021-03-07 MED ORDER — BUPIVACAINE HCL 0.5 % IJ SOLN: 10.0000 mL | Freq: Once | INTRAMUSCULAR | Status: DC

## 2021-03-07 NOTE — Patient Instructions (Signed)
Thank you for choosing [facility]. As part of our ongoing efforts to improve patient experience, we would appreciate your feedback. Please fill out the short survey that you will receive by mail or MyChart. Your opinion is important to us! -Dr Mirra Basilio  

## 2021-03-07 NOTE — Progress Notes (Signed)
Subjective  Fetal Movement? yes Contractions? no Leaking Fluid? no Vaginal Bleeding? no  Objective  BP 110/70   Wt 178 lb (80.7 kg)   LMP 06/15/2020 (Exact Date)   BMI 32.56 kg/m  General: NAD Pumonary: no increased work of breathing Abdomen: gravid, non-tender Extremities: no edema Psychiatric: mood appropriate, affect full  Assessment  30 y.o. G2P1001 at [redacted]w[redacted]d by  03/22/2021, by Last Menstrual Period presenting for routine prenatal visit  Plan   Problem List Items Addressed This Visit      Other   S/P cesarean section   Supervision of high risk pregnancy, antepartum    Other Visit Diagnoses    [redacted] weeks gestation of pregnancy    -  Primary   Supervision of high risk pregnancy in third trimester       Rh negative state in antepartum period, second trimester          pregnancy2 Problems (from 06/15/20 to present)    Problem Noted Resolved   Supervision of high risk pregnancy, antepartum 07/31/2020 by Nadara Mustard, MD No   Overview Addendum 03/07/2021  2:08 PM by Nadara Mustard, MD    Clinic [facility] Prenatal Labs  Dating LMP, confirmed w [place] 9 wks Blood type: A/Negative/-- (10/18 1638)   Genetic Screen NIPS:nml XX Antibody:Negative (01/31 1609)  Anatomic [place] WSOB x2, nml Rubella: 26.30 (10/18 1638) Varicella: IMMUNE  GTT Third trimester:  RPR: Non Reactive (01/31 1609)   Rhogam 12/2020 HBsAg: Negative (10/18 1638)   TDaP vaccine  01/13/21 Flu Shot:work, 10/21 HIV: Non Reactive (01/31 1609)   Baby Food Bottle  TOI:[id number]/-- (04/12 1422)  Contraception OCP Pap:07/31/2020  CBB  No   CS/VBAC Repeat desired, PH   Support Person Andy           S/P cesarean section 08/30/2015 by Nadara Mustard, MD No   Overview Signed 07/31/2020  3:18 PM by Nadara Mustard, MD    OB/GYN  Counseling Note  30 y.o. G2P1001 at [redacted]w[redacted]d with Estimated Date of Delivery: 03/22/21 was seen today in office to discuss trial of labor after cesarean section (TOLAC) versus elective repeat  cesarean delivery (ERCD). The following risks were discussed with the patient.  Risk of uterine rupture at term is 0.78 percent with TOLAC and 0.22 percent with ERCD. 1 in 10 uterine ruptures will result in neonatal death or neurological injury. The benefits of a trial of labor after cesarean (TOLAC) resulting in a vaginal birth after cesarean (VBAC) include the following: shorter length of hospital stay and postpartum recovery (in most cases); fewer complications, such as postpartum fever, wound or uterine infection, thromboembolism (blood clots in the leg or lung), need for blood transfusion and fewer neonatal breathing problems.  The risks of an attempted VBAC or TOLAC include the following: Risk of failed trial of labor after cesarean (TOLAC) without a vaginal birth after cesarean (VBAC) resulting in repeat cesarean delivery (RCD) in about 20 to 40 percent of women who attempt VBAC.  Risk of rupture of uterus resulting in an emergency cesarean delivery. The risk of uterine rupture may be related in part to the type of uterine incision made during the first cesarean delivery. A previous transverse uterine incision has the lowest risk of rupture (0.2 to 1.5 percent risk). Vertical or T-shaped uterine incisions have a higher risk of uterine rupture (4 to 9 percent risk)The risk of fetal death is very low with both VBAC and elective repeat cesarean delivery (ERCD), but the likelihood  of fetal death is higher with VBAC than with ERCD. Maternal death is very rare with either type of delivery.  The risks of an elective repeat cesarean delivery (ERCD) were reviewed with the patient including but not limited to: 12/998 risk of uterine rupture which could have serious consequences, bleeding which may require transfusion; infection which may require antibiotics; injury to bowel, bladder or other surrounding organs (bowel, bladder, ureters); injury to the fetus; need for additional procedures including hysterectomy  in the event of a life-threatening hemorrhage; thromboembolic phenomenon; abnormal placentation; incisional problems; death and other postoperative or anesthesia complications.    Desires R CS 39 weeks w PH        PNV, FMC, Labor precautions  CS May 3, bottle feeding plans and OCPs  Annamarie Major, MD, [facility], [facility] 03/07/2021  2:08 PM

## 2021-03-11 ENCOUNTER — Ambulatory Visit (INDEPENDENT_AMBULATORY_CARE_PROVIDER_SITE_OTHER): Payer: No Typology Code available for payment source | Admitting: Obstetrics & Gynecology

## 2021-03-11 ENCOUNTER — Other Ambulatory Visit: Payer: Self-pay

## 2021-03-11 ENCOUNTER — Encounter: Payer: Self-pay | Admitting: Obstetrics & Gynecology

## 2021-03-11 VITALS — BP 120/80 | Ht 62.0 in | Wt 178.0 lb

## 2021-03-11 DIAGNOSIS — Z98891 History of uterine scar from previous surgery: Secondary | ICD-10-CM

## 2021-03-11 DIAGNOSIS — Z3A38 38 weeks gestation of pregnancy: Secondary | ICD-10-CM

## 2021-03-11 DIAGNOSIS — O0990 Supervision of high risk pregnancy, unspecified, unspecified trimester: Secondary | ICD-10-CM

## 2021-03-11 NOTE — Patient Instructions (Signed)
Cesarean Delivery, Care After This sheet gives you information about how to care for yourself after your procedure. Your health care provider may also give you more specific instructions. If you have problems or questions, contact your health care provider. What can I expect after the procedure? After the procedure, it is common to have:  A small amount of blood or clear fluid coming from the incision.  Some redness, swelling, and pain in your incision area.  Some abdominal pain and soreness.  Vaginal bleeding (lochia). Even though you did not have a vaginal delivery, you will still have vaginal bleeding and discharge.  Pelvic cramps.  Fatigue. You may have pain, swelling, and discomfort in the tissue between your vagina and your anus (perineum) if:  Your C-section was unplanned, and you were allowed to labor and push.  An incision was made in the area (episiotomy) or the tissue tore during attempted vaginal delivery. Follow these instructions at home: Incision care  Follow instructions from your health care provider about how to take care of your incision. Make sure you: ? Wash your hands with soap and water before you change your bandage (dressing). If soap and water are not available, use hand sanitizer. ? If you have a dressing, change it or remove it as told by your health care provider. ? Leave stitches (sutures), skin staples, skin glue, or adhesive strips in place. These skin closures may need to stay in place for 2 weeks or longer. If adhesive strip edges start to loosen and curl up, you may trim the loose edges. Do not remove adhesive strips completely unless your health care provider tells you to do that.  Check your incision area every day for signs of infection. Check for: ? More redness, swelling, or pain. ? More fluid or blood. ? Warmth. ? Pus or a bad smell.  Do not take baths, swim, or use a hot tub until your health care provider says it's okay. Ask your health  care provider if you can take showers.  When you cough or sneeze, hug a pillow. This helps with pain and decreases the chance of your incision opening up (dehiscing). Do this until your incision heals.   Medicines  Take over-the-counter and prescription medicines only as told by your health care provider.  If you were prescribed an antibiotic medicine, take it as told by your health care provider. Do not stop taking the antibiotic even if you start to feel better.  Do not drive or use heavy machinery while taking prescription pain medicine. Lifestyle  Do not drink alcohol. This is especially important if you are breastfeeding or taking pain medicine.  Do not use any products that contain nicotine or tobacco, such as cigarettes, e-cigarettes, and chewing tobacco. If you need help quitting, ask your health care provider. Eating and drinking  Drink at least 8 eight-ounce glasses of water every day unless told not to by your health care provider. If you breastfeed, you may need to drink even more water.  Eat high-fiber foods every day. These foods may help prevent or relieve constipation. High-fiber foods include: ? Whole grain cereals and breads. ? Brown rice. ? Beans. ? Fresh fruits and vegetables. Activity  If possible, have someone help you care for your baby and help with household activities for at least a few days after you leave the hospital.  Return to your normal activities as told by your health care provider. Ask your health care provider what activities are safe for   you.  Rest as much as possible. Try to rest or take a nap while your baby is sleeping.  Do not lift anything that is heavier than 10 lbs (4.5 kg), or the limit that you were told, until your health care provider says that it is safe.  Talk with your health care provider about when you can engage in sexual activity. This may depend on your: ? Risk of infection. ? How fast you heal. ? Comfort and desire to  engage in sexual activity.   General instructions  Do not use tampons or douches until your health care provider approves.  Wear loose, comfortable clothing and a supportive and well-fitting bra.  Keep your perineum clean and dry. Wipe from front to back when you use the toilet.  If you pass a blood clot, save it and call your health care provider to discuss. Do not flush blood clots down the toilet before you get instructions from your health care provider.  Keep all follow-up visits for you and your baby as told by your health care provider. This is important. Contact a health care provider if:  You have: ? A fever. ? Bad-smelling vaginal discharge. ? Pus or a bad smell coming from your incision. ? Difficulty or pain when urinating. ? A sudden increase or decrease in the frequency of your bowel movements. ? More redness, swelling, or pain around your incision. ? More fluid or blood coming from your incision. ? A rash. ? Nausea. ? Little or no interest in activities you used to enjoy. ? Questions about caring for yourself or your baby.  Your incision feels warm to the touch.  Your breasts turn red or become painful or hard.  You feel unusually sad or worried.  You vomit.  You pass a blood clot from your vagina.  You urinate more than usual.  You are dizzy or light-headed. Get help right away if:  You have: ? Pain that does not go away or get better with medicine. ? Chest pain. ? Difficulty breathing. ? Blurred vision or spots in your vision. ? Thoughts about hurting yourself or your baby. ? New pain in your abdomen or in one of your legs. ? A severe headache.  You faint.  You bleed from your vagina so much that you fill more than one sanitary pad in one hour. Bleeding should not be heavier than your heaviest period. Summary  After the procedure, it is common to have pain at your incision site, abdominal cramping, and slight bleeding from your vagina.  Check  your incision area every day for signs of infection.  Tell your health care provider about any unusual symptoms.  Keep all follow-up visits for you and your baby as told by your health care provider. This information is not intended to replace advice given to you by your health care provider. Make sure you discuss any questions you have with your health care provider. Document Revised: 05/11/2018 Document Reviewed: 05/11/2018 Elsevier Patient Education  2021 [employer]  

## 2021-03-11 NOTE — H&P (View-Only) (Signed)
PRE-OPERATIVE HISTORY AND PHYSICAL EXAM  HPI:  Alicia [name] is a 30 y.o. G2P1001.  Patient's last menstrual period was 06/15/2020 (exact date).  [redacted]w[redacted]d Estimated Date of Delivery: 03/22/21  She is being admitted for Elective repeat cesarean section at 39 weeks as he has had prior cesarean 2016  PMHx: She  has a past medical history of Anemia, Family history of breast cancer, and Family history of ovarian cancer. Also,  has a past surgical history that includes Cesarean section (N/A, 08/30/2015) and Cesarean section., family history includes Breast cancer (age of onset: 30) in her paternal aunt; Ovarian cancer in her paternal grandmother; Ovarian cancer (age of onset: 43) in her cousin; Pancreatic cancer in her maternal grandfather.,  reports that she has never smoked. She has never used smokeless tobacco. She reports that she does not drink alcohol and does not use drugs. OB History  Gravida Para Term Preterm AB Living  2 1 1  0 0 1  SAB IAB Ectopic Multiple Live Births  0 0 0   1    # Outcome Date GA Lbr Len/2nd Weight Sex Delivery Anes PTL Lv  2 Current           1 Term 08/30/15 [redacted]w[redacted]d  9 lb 3.1 oz (4.17 kg) F CS-LTranv EPI, Spinal  LIV  Patient denies any other pertinent gynecologic issues. See prenatal record for more complete H&P   Current Outpatient Medications:  .  acetaminophen (TYLENOL) 325 MG tablet, Take 650 mg by mouth every 6 (six) hours as needed for moderate pain., Disp: , Rfl:  .  Prenatal Vit-Fe Fumarate-FA (PRENATAL MULTIVITAMIN) TABS tablet, Take 1 tablet by mouth daily at 12 noon., Disp: , Rfl:  .  sertraline (ZOLOFT) 50 MG tablet, Take 1 tablet (50 mg total) by mouth daily., Disp: 30 tablet, Rfl: 6  Current Facility-Administered Medications:  .  bupivacaine (MARCAINE) 0.5 % (with pres) injection 10 mL, 10 mL, Infiltration, Once, Dametri Ozburn P, MD .  bupivacaine 0.25 % ON-Q pump DUAL CATH 400 mL, 400 mL, Other, Continuous, Maizy Davanzo, [redacted]w[redacted]d, MD Also, has No  Known Allergies.  Review of Systems  Constitutional: Negative for chills, fever and malaise/fatigue.  HENT: Negative for congestion, sinus pain and sore throat.   Eyes: Negative for blurred vision and pain.  Respiratory: Negative for cough and wheezing.   Cardiovascular: Negative for chest pain and leg swelling.  Gastrointestinal: Negative for abdominal pain, constipation, diarrhea, heartburn, nausea and vomiting.  Genitourinary: Negative for dysuria, frequency, hematuria and urgency.  Musculoskeletal: Negative for back pain, joint pain, myalgias and neck pain.  Skin: Negative for itching and rash.  Neurological: Negative for dizziness, tremors and weakness.  Endo/Heme/Allergies: Does not bruise/bleed easily.  Psychiatric/Behavioral: Negative for depression. The patient is not nervous/anxious and does not have insomnia.     Objective: LMP 06/15/2020 (Exact Date)  There were no vitals filed for this visit. Physical Exam Constitutional:      General: She is not in acute distress.    Appearance: She is well-developed.  HENT:     Head: Normocephalic and atraumatic. No laceration.     Right Ear: Hearing normal.     Left Ear: Hearing normal.     Mouth/Throat:     Pharynx: Uvula midline.  Eyes:     Pupils: Pupils are equal, round, and reactive to light.  Neck:     Thyroid: No thyromegaly.  Cardiovascular:     Rate and Rhythm: Normal rate and  regular rhythm.     Heart sounds: No murmur heard. No friction rub. No gallop.   Pulmonary:     Effort: Pulmonary effort is normal. No respiratory distress.     Breath sounds: Normal breath sounds. No wheezing.  Abdominal:     General: Bowel sounds are normal. There is no distension.     Palpations: Abdomen is soft.     Tenderness: There is no abdominal tenderness. There is no rebound.  Musculoskeletal:        General: Normal range of motion.     Cervical back: Normal range of motion and neck supple.  Neurological:     Mental Status: She  is alert and oriented to person, place, and time.     Cranial Nerves: No cranial nerve deficit.  Skin:    General: Skin is warm and dry.  Psychiatric:        Judgment: Judgment normal.  Vitals reviewed.     Assessment: 1. Supervision of high risk pregnancy, antepartum   2. S/P cesarean section   3. [redacted] weeks gestation of pregnancy     PLAN: 1.  Cesarean Delivery as Scheduled.  Plans OCP for pp contraception Plans to bottle feed A NEG, Rhogam 2/22 TDaP and Flu Shots UTD  Patient will undergo surgical management with Cesarean Section.   The risks of surgery were discussed in detail with the patient including but not limited to: bleeding which may require transfusion or reoperation; infection which may require antibiotics; injury to surrounding organs which may involve bowel, bladder, ureters ; need for additional procedures including laparoscopy or laparotomy; thromboembolic phenomenon, surgical site problems and other postoperative/anesthesia complications. Likelihood of success in alleviating the patient's condition was discussed. Routine postoperative instructions will be reviewed with the patient and her family in detail after surgery.  The patient concurred with the proposed plan, giving informed written consent for the surgery.  Patient will be NPO for the procedure.  Preoperative prophylactic antibiotics, as necessary, and SCDs ordered on call to the OR.   Annamarie Major, M.D. 03/11/2021 2:52 PM

## 2021-03-11 NOTE — Progress Notes (Signed)
    PRE-OPERATIVE HISTORY AND PHYSICAL EXAM  HPI:  Alicia [name] is a 30 y.o. G2P1001.  Patient's last menstrual period was 06/15/2020 (exact date).  [redacted]w[redacted]d Estimated Date of Delivery: 03/22/21  She is being admitted for Elective repeat cesarean section at 39 weeks as he has had prior cesarean 2016  PMHx: She  has a past medical history of Anemia, Family history of breast cancer, and Family history of ovarian cancer. Also,  has a past surgical history that includes Cesarean section (N/A, 08/30/2015) and Cesarean section., family history includes Breast cancer (age of onset: 35) in her paternal aunt; Ovarian cancer in her paternal grandmother; Ovarian cancer (age of onset: 30) in her cousin; Pancreatic cancer in her maternal grandfather.,  reports that she has never smoked. She has never used smokeless tobacco. She reports that she does not drink alcohol and does not use drugs. OB History  Gravida Para Term Preterm AB Living  2 1 1 0 0 1  SAB IAB Ectopic Multiple Live Births  0 0 0   1    # Outcome Date GA Lbr Len/2nd Weight Sex Delivery Anes PTL Lv  2 Current           1 Term 08/30/15 [redacted]w[redacted]d  9 lb 3.1 oz (4.17 kg) F CS-LTranv EPI, Spinal  LIV  Patient denies any other pertinent gynecologic issues. See prenatal record for more complete H&P   Current Outpatient Medications:  .  acetaminophen (TYLENOL) 325 MG tablet, Take 650 mg by mouth every 6 (six) hours as needed for moderate pain., Disp: , Rfl:  .  Prenatal Vit-Fe Fumarate-FA (PRENATAL MULTIVITAMIN) TABS tablet, Take 1 tablet by mouth daily at 12 noon., Disp: , Rfl:  .  sertraline (ZOLOFT) 50 MG tablet, Take 1 tablet (50 mg total) by mouth daily., Disp: 30 tablet, Rfl: 6  Current Facility-Administered Medications:  .  bupivacaine (MARCAINE) 0.5 % (with pres) injection 10 mL, 10 mL, Infiltration, Once, Giana Castner P, MD .  bupivacaine 0.25 % ON-Q pump DUAL CATH 400 mL, 400 mL, Other, Continuous, Myranda Pavone P, MD Also, has No  Known Allergies.  Review of Systems  Constitutional: Negative for chills, fever and malaise/fatigue.  HENT: Negative for congestion, sinus pain and sore throat.   Eyes: Negative for blurred vision and pain.  Respiratory: Negative for cough and wheezing.   Cardiovascular: Negative for chest pain and leg swelling.  Gastrointestinal: Negative for abdominal pain, constipation, diarrhea, heartburn, nausea and vomiting.  Genitourinary: Negative for dysuria, frequency, hematuria and urgency.  Musculoskeletal: Negative for back pain, joint pain, myalgias and neck pain.  Skin: Negative for itching and rash.  Neurological: Negative for dizziness, tremors and weakness.  Endo/Heme/Allergies: Does not bruise/bleed easily.  Psychiatric/Behavioral: Negative for depression. The patient is not nervous/anxious and does not have insomnia.     Objective: LMP 06/15/2020 (Exact Date)  There were no vitals filed for this visit. Physical Exam Constitutional:      General: She is not in acute distress.    Appearance: She is well-developed.  HENT:     Head: Normocephalic and atraumatic. No laceration.     Right Ear: Hearing normal.     Left Ear: Hearing normal.     Mouth/Throat:     Pharynx: Uvula midline.  Eyes:     Pupils: Pupils are equal, round, and reactive to light.  Neck:     Thyroid: No thyromegaly.  Cardiovascular:     Rate and Rhythm: Normal rate and   regular rhythm.     Heart sounds: No murmur heard. No friction rub. No gallop.   Pulmonary:     Effort: Pulmonary effort is normal. No respiratory distress.     Breath sounds: Normal breath sounds. No wheezing.  Abdominal:     General: Bowel sounds are normal. There is no distension.     Palpations: Abdomen is soft.     Tenderness: There is no abdominal tenderness. There is no rebound.  Musculoskeletal:        General: Normal range of motion.     Cervical back: Normal range of motion and neck supple.  Neurological:     Mental Status: She  is alert and oriented to person, place, and time.     Cranial Nerves: No cranial nerve deficit.  Skin:    General: Skin is warm and dry.  Psychiatric:        Judgment: Judgment normal.  Vitals reviewed.     Assessment: 1. Supervision of high risk pregnancy, antepartum   2. S/P cesarean section   3. [redacted] weeks gestation of pregnancy     PLAN: 1.  Cesarean Delivery as Scheduled.  Plans OCP for pp contraception Plans to bottle feed A NEG, Rhogam 2/22 TDaP and Flu Shots UTD  Patient will undergo surgical management with Cesarean Section.   The risks of surgery were discussed in detail with the patient including but not limited to: bleeding which may require transfusion or reoperation; infection which may require antibiotics; injury to surrounding organs which may involve bowel, bladder, ureters ; need for additional procedures including laparoscopy or laparotomy; thromboembolic phenomenon, surgical site problems and other postoperative/anesthesia complications. Likelihood of success in alleviating the patient's condition was discussed. Routine postoperative instructions will be reviewed with the patient and her family in detail after surgery.  The patient concurred with the proposed plan, giving informed written consent for the surgery.  Patient will be NPO for the procedure.  Preoperative prophylactic antibiotics, as necessary, and SCDs ordered on call to the OR.   Annamarie Major, M.D. 30/26/2022 2:52 PM

## 2021-03-12 ENCOUNTER — Encounter
Admission: RE | Admit: 2021-03-12 | Discharge: 2021-03-12 | Disposition: A | Discharge status: Home / Self Care | Payer: No Typology Code available for payment source | Source: Ambulatory Visit | Attending: Obstetrics & Gynecology | Admitting: Obstetrics & Gynecology

## 2021-03-12 HISTORY — DX: Depression, unspecified: F32.A

## 2021-03-12 HISTORY — DX: Anxiety disorder, unspecified: F41.9

## 2021-03-12 NOTE — Patient Instructions (Signed)
Your procedure is scheduled on: Mar 18, 2021 Tuesday REPORT TO EMERGENCY DEPARTMENT AT 5:30 AM.   REMEMBER: Instructions that are not followed completely may result in serious medical risk, up to and including death; or upon the discretion of your surgeon and anesthesiologist your surgery may need to be rescheduled.  Do not eat food after midnight the night before surgery.  No gum chewing, lozengers or hard candies.  You may however, drink CLEAR liquids up to 2 hours before you are scheduled to arrive for your surgery. Do not drink anything within 2 hours of your scheduled arrival time.  Clear liquids include: - water  - apple juice without pulp - gatorade (not RED, PURPLE, OR BLUE) - black coffee or tea (Do NOT add milk or creamers to the coffee or tea) Do NOT drink anything that is not on this list.  Type 1 and Type 2 diabetics should only drink water.   TAKE THESE MEDICATIONS THE MORNING OF SURGERY WITH A SIP OF WATER: NONE  One week prior to surgery: Stop Anti-inflammatories (NSAIDS) such as Advil, Aleve, Ibuprofen, Motrin, Naproxen, Naprosyn and ASPIRIN OR Aspirin based products such as Excedrin, Goodys Powder, BC Powder. Stop ANY OVER THE COUNTER supplements until after surgery.  No Alcohol for 24 hours before or after surgery.  No Smoking including e-cigarettes for 24 hours prior to surgery.  No chewable tobacco products for at least 6 hours prior to surgery.  No nicotine patches on the day of surgery.  Do not use any "recreational" drugs for at least a week prior to your surgery.  Please be advised that the combination of cocaine and anesthesia may have negative outcomes, up to and including death. If you test positive for cocaine, your surgery will be cancelled.  On the morning of surgery brush your teeth with toothpaste and water, you may rinse your mouth with mouthwash if you wish. Do not swallow any toothpaste or mouthwash.  Do not wear jewelry, make-up, hairpins,  clips or nail polish.  Do not wear lotions, powders, or perfumes OR DEODORANT.  Do not shave body from the neck down 48 hours prior to surgery just in case you cut yourself which could leave a site for infection.  Also, freshly shaved skin may become irritated if using the CHG soap.  Contact lenses, hearing aids and dentures may not be worn into surgery.  Do not bring valuables to the hospital. [facility] is not responsible for any missing/lost belongings or valuables.   Use CHG wipes as directed on instruction sheet.  Notify your doctor if there is any change in your medical condition (cold, fever, infection).  Wear comfortable clothing (specific to your surgery type) to the hospital.  Plan for stool softeners for home use; pain medications have a tendency to cause constipation. You can also help prevent constipation by eating foods high in fiber such as fruits and vegetables and drinking plenty of fluids as your diet allows.  After surgery, you can help prevent lung complications by doing breathing exercises.  Take deep breaths and cough every 1-2 hours. Your doctor may order a device called an Incentive Spirometer to help you take deep breaths. When coughing or sneezing, hold a pillow firmly against your incision with both hands. This is called "splinting." Doing this helps protect your incision. It also decreases belly discomfort.  If you are being admitted to the hospital overnight, YOU MAY BRING A SMALL BAG WITH YOU.  Please call the Pre-admissions Testing Dept. at [phone] if you have any questions about these instructions.  Surgery Visitation Policy:  Patients undergoing a surgery or procedure may have one family member or support person with them as long as that person is not COVID-19 positive or experiencing its symptoms.  That person may remain in the waiting area during the procedure.  Inpatient Visitation:    Visiting hours are 7 a.m. to 8 p.m. Inpatients will  be allowed two visitors daily. The visitors may change each day during the patient's stay. No visitors under the age of 30. Any visitor under the age of 35 must be accompanied by an adult. The visitor must pass COVID-19 screenings, use hand sanitizer when entering and exiting the patient's room and wear a mask at all times, including in the patient's room. Patients must also wear a mask when staff or their visitor are in the room. Masking is required regardless of vaccination status.

## 2021-03-17 ENCOUNTER — Other Ambulatory Visit: Payer: Self-pay

## 2021-03-17 ENCOUNTER — Other Ambulatory Visit
Admission: RE | Admit: 2021-03-17 | Discharge: 2021-03-17 | Disposition: A | Discharge status: Home / Self Care | Payer: No Typology Code available for payment source | Source: Ambulatory Visit | Attending: Obstetrics & Gynecology | Admitting: Obstetrics & Gynecology

## 2021-03-17 DIAGNOSIS — Z01812 Encounter for preprocedural laboratory examination: Secondary | ICD-10-CM | POA: Insufficient documentation

## 2021-03-17 DIAGNOSIS — Z20822 Contact with and (suspected) exposure to covid-19: Secondary | ICD-10-CM | POA: Insufficient documentation

## 2021-03-17 LAB — CBC
HCT: 31.7 % — ABNORMAL LOW (ref 36.0–46.0)
Hemoglobin: 10.3 g/dL — ABNORMAL LOW (ref 12.0–15.0)
MCH: 28.9 pg (ref 26.0–34.0)
MCHC: 32.5 g/dL (ref 30.0–36.0)
MCV: 88.8 fL (ref 80.0–100.0)
Platelets: 169 10*3/uL (ref 150–400)
RBC: 3.57 MIL/uL — ABNORMAL LOW (ref 3.87–5.11)
RDW: 14.4 % (ref 11.5–15.5)
WBC: 8.6 10*3/uL (ref 4.0–10.5)
nRBC: 0 % (ref 0.0–0.2)

## 2021-03-17 LAB — TYPE AND SCREEN
ABO/RH(D): A NEG
Antibody Screen: NEGATIVE
Extend sample reason: UNDETERMINED

## 2021-03-18 ENCOUNTER — Inpatient Hospital Stay: Payer: No Typology Code available for payment source | Admitting: Anesthesiology

## 2021-03-18 ENCOUNTER — Inpatient Hospital Stay
Admission: RE | Admit: 2021-03-18 | Discharge: 2021-03-20 | DRG: 788 | Disposition: A | Discharge status: Home / Self Care | Payer: No Typology Code available for payment source | Attending: Obstetrics & Gynecology | Admitting: Obstetrics & Gynecology

## 2021-03-18 ENCOUNTER — Encounter: Payer: Self-pay | Admitting: Obstetrics & Gynecology

## 2021-03-18 ENCOUNTER — Encounter
Admission: RE | Disposition: A | Discharge status: Home / Self Care | Payer: Self-pay | Source: Home / Self Care | Attending: Obstetrics & Gynecology

## 2021-03-18 DIAGNOSIS — O9902 Anemia complicating childbirth: Secondary | ICD-10-CM | POA: Diagnosis present

## 2021-03-18 DIAGNOSIS — O34211 Maternal care for low transverse scar from previous cesarean delivery: Principal | ICD-10-CM | POA: Diagnosis present

## 2021-03-18 DIAGNOSIS — Z79899 Other long term (current) drug therapy: Secondary | ICD-10-CM | POA: Diagnosis not present

## 2021-03-18 DIAGNOSIS — O34219 Maternal care for unspecified type scar from previous cesarean delivery: Secondary | ICD-10-CM

## 2021-03-18 DIAGNOSIS — Z3A39 39 weeks gestation of pregnancy: Secondary | ICD-10-CM

## 2021-03-18 DIAGNOSIS — Z3A38 38 weeks gestation of pregnancy: Secondary | ICD-10-CM | POA: Diagnosis not present

## 2021-03-18 DIAGNOSIS — Z20822 Contact with and (suspected) exposure to covid-19: Secondary | ICD-10-CM | POA: Diagnosis present

## 2021-03-18 DIAGNOSIS — Z98891 History of uterine scar from previous surgery: Secondary | ICD-10-CM

## 2021-03-18 LAB — ABO/RH: ABO/RH(D): A NEG

## 2021-03-18 LAB — SARS CORONAVIRUS 2 (TAT 6-24 HRS): SARS Coronavirus 2: NEGATIVE

## 2021-03-18 SURGERY — Surgical Case
Anesthesia: Spinal

## 2021-03-18 MED ORDER — LACTATED RINGERS IV BOLUS
500.0000 mL | Freq: Once | INTRAVENOUS | Status: AC
  Administered 2021-03-18: 500 mL via INTRAVENOUS

## 2021-03-18 MED ORDER — MEPERIDINE HCL 25 MG/ML IJ SOLN: 6.2500 mg | INTRAMUSCULAR | Status: DC | PRN

## 2021-03-18 MED ORDER — DIBUCAINE (PERIANAL) 1 % EX OINT: 1.0000 "application " | TOPICAL_OINTMENT | CUTANEOUS | Status: DC | PRN

## 2021-03-18 MED ORDER — ACETAMINOPHEN 325 MG PO TABS
ORAL_TABLET | ORAL | Status: AC
  Filled 2021-03-18: qty 2

## 2021-03-18 MED ORDER — BUPIVACAINE ON-Q PAIN PUMP (FOR ORDER SET NO CHG)
INJECTION | Status: DC
  Filled 2021-03-18: qty 1

## 2021-03-18 MED ORDER — SODIUM CHLORIDE 0.9 % IV SOLN
2.0000 g | INTRAVENOUS | Status: AC
  Administered 2021-03-18: 2 g via INTRAVENOUS
  Filled 2021-03-18: qty 2

## 2021-03-18 MED ORDER — LACTATED RINGERS IV SOLN: INTRAVENOUS | Status: DC

## 2021-03-18 MED ORDER — NALBUPHINE HCL 10 MG/ML IJ SOLN
5.0000 mg | Freq: Once | INTRAMUSCULAR | Status: AC | PRN
  Administered 2021-03-18: 5 mg via INTRAVENOUS

## 2021-03-18 MED ORDER — ACETAMINOPHEN 325 MG PO TABS
650.0000 mg | ORAL_TABLET | ORAL | Status: DC | PRN
  Administered 2021-03-18 – 2021-03-19 (×2): 650 mg via ORAL
  Filled 2021-03-18: qty 2

## 2021-03-18 MED ORDER — KETOROLAC TROMETHAMINE 30 MG/ML IJ SOLN
30.0000 mg | Freq: Four times a day (QID) | INTRAMUSCULAR | Status: AC | PRN
  Administered 2021-03-18: 30 mg via INTRAVENOUS
  Filled 2021-03-18: qty 1

## 2021-03-18 MED ORDER — KETOROLAC TROMETHAMINE 30 MG/ML IJ SOLN: 30.0000 mg | Freq: Four times a day (QID) | INTRAMUSCULAR | Status: AC | PRN

## 2021-03-18 MED ORDER — ONDANSETRON HCL 4 MG/2ML IJ SOLN
4.0000 mg | Freq: Three times a day (TID) | INTRAMUSCULAR | Status: DC | PRN
  Administered 2021-03-19: 4 mg via INTRAVENOUS
  Filled 2021-03-18: qty 2

## 2021-03-18 MED ORDER — SIMETHICONE 80 MG PO CHEW: 80.0000 mg | CHEWABLE_TABLET | ORAL | Status: DC | PRN

## 2021-03-18 MED ORDER — OXYTOCIN-SODIUM CHLORIDE 30-0.9 UT/500ML-% IV SOLN
2.5000 [IU]/h | INTRAVENOUS | Status: AC
  Administered 2021-03-18: 2.5 [IU]/h via INTRAVENOUS
  Filled 2021-03-18: qty 500

## 2021-03-18 MED ORDER — KETOROLAC TROMETHAMINE 30 MG/ML IJ SOLN
30.0000 mg | Freq: Four times a day (QID) | INTRAMUSCULAR | Status: AC
  Administered 2021-03-18 – 2021-03-19 (×3): 30 mg via INTRAVENOUS
  Filled 2021-03-18 (×3): qty 1

## 2021-03-18 MED ORDER — SENNOSIDES-DOCUSATE SODIUM 8.6-50 MG PO TABS
2.0000 | ORAL_TABLET | ORAL | Status: DC
  Administered 2021-03-18 – 2021-03-19 (×2): 2 via ORAL
  Filled 2021-03-18 (×2): qty 2

## 2021-03-18 MED ORDER — MORPHINE SULFATE (PF) 0.5 MG/ML IJ SOLN
INTRAMUSCULAR | Status: AC
  Filled 2021-03-18: qty 10

## 2021-03-18 MED ORDER — SOD CITRATE-CITRIC ACID 500-334 MG/5ML PO SOLN
30.0000 mL | ORAL | Status: AC
  Administered 2021-03-18: 30 mL via ORAL
  Filled 2021-03-18: qty 15

## 2021-03-18 MED ORDER — FENTANYL CITRATE (PF) 100 MCG/2ML IJ SOLN
INTRAMUSCULAR | Status: DC | PRN
  Administered 2021-03-18: 15 ug via INTRATHECAL

## 2021-03-18 MED ORDER — POVIDONE-IODINE 10 % EX SWAB: 2.0000 "application " | Freq: Once | CUTANEOUS | Status: DC

## 2021-03-18 MED ORDER — PHENYLEPHRINE 40 MCG/ML (10ML) SYRINGE FOR IV PUSH (FOR BLOOD PRESSURE SUPPORT)
PREFILLED_SYRINGE | INTRAVENOUS | Status: DC | PRN
  Administered 2021-03-18: 100 ug via INTRAVENOUS

## 2021-03-18 MED ORDER — SODIUM CHLORIDE 0.9% FLUSH: 3.0000 mL | INTRAVENOUS | Status: DC | PRN

## 2021-03-18 MED ORDER — NALOXONE HCL 4 MG/10ML IJ SOLN
1.0000 ug/kg/h | INTRAVENOUS | Status: DC | PRN
  Filled 2021-03-18: qty 5

## 2021-03-18 MED ORDER — NALBUPHINE HCL 10 MG/ML IJ SOLN: 5.0000 mg | Freq: Once | INTRAMUSCULAR | Status: AC | PRN

## 2021-03-18 MED ORDER — ACETAMINOPHEN 500 MG PO TABS
1000.0000 mg | ORAL_TABLET | Freq: Four times a day (QID) | ORAL | Status: AC
  Administered 2021-03-18 – 2021-03-19 (×3): 1000 mg via ORAL
  Filled 2021-03-18 (×4): qty 2

## 2021-03-18 MED ORDER — PROPOFOL 10 MG/ML IV BOLUS
INTRAVENOUS | Status: AC
  Filled 2021-03-18: qty 20

## 2021-03-18 MED ORDER — ONDANSETRON HCL 4 MG/2ML IJ SOLN
INTRAMUSCULAR | Status: AC
  Filled 2021-03-18: qty 2

## 2021-03-18 MED ORDER — WITCH HAZEL-GLYCERIN EX PADS: 1.0000 "application " | MEDICATED_PAD | CUTANEOUS | Status: DC | PRN

## 2021-03-18 MED ORDER — NALBUPHINE HCL 10 MG/ML IJ SOLN
5.0000 mg | INTRAMUSCULAR | Status: DC | PRN
  Filled 2021-03-18: qty 1

## 2021-03-18 MED ORDER — FENTANYL CITRATE (PF) 100 MCG/2ML IJ SOLN
25.0000 ug | INTRAMUSCULAR | Status: DC | PRN
Start: 2021-03-18 — End: 2021-03-18

## 2021-03-18 MED ORDER — SODIUM CHLORIDE 0.9 % IV SOLN
INTRAVENOUS | Status: DC | PRN
  Administered 2021-03-18: 60 ug/min via INTRAVENOUS

## 2021-03-18 MED ORDER — PHENYLEPHRINE HCL (PRESSORS) 10 MG/ML IV SOLN: INTRAVENOUS | Status: DC | PRN

## 2021-03-18 MED ORDER — NALOXONE HCL 0.4 MG/ML IJ SOLN: 0.4000 mg | INTRAMUSCULAR | Status: DC | PRN

## 2021-03-18 MED ORDER — OXYTOCIN-SODIUM CHLORIDE 30-0.9 UT/500ML-% IV SOLN
INTRAVENOUS | Status: DC | PRN
  Administered 2021-03-18: 600 mL/h via INTRAVENOUS

## 2021-03-18 MED ORDER — SIMETHICONE 80 MG PO CHEW
80.0000 mg | CHEWABLE_TABLET | Freq: Three times a day (TID) | ORAL | Status: DC
  Administered 2021-03-18 – 2021-03-20 (×5): 80 mg via ORAL
  Filled 2021-03-18 (×5): qty 1

## 2021-03-18 MED ORDER — POVIDONE-IODINE 10 % EX SWAB
2.0000 | Freq: Once | CUTANEOUS | Status: DC
Start: 2021-03-18 — End: 2021-03-18

## 2021-03-18 MED ORDER — DIPHENHYDRAMINE HCL 25 MG PO CAPS: 25.0000 mg | ORAL_CAPSULE | Freq: Four times a day (QID) | ORAL | Status: DC | PRN

## 2021-03-18 MED ORDER — ONDANSETRON HCL 4 MG/2ML IJ SOLN: 4.0000 mg | Freq: Once | INTRAMUSCULAR | Status: DC | PRN

## 2021-03-18 MED ORDER — SODIUM CHLORIDE 0.9 % IV SOLN: 2.0000 g | INTRAVENOUS | Status: DC

## 2021-03-18 MED ORDER — PRENATAL MULTIVITAMIN CH
1.0000 | ORAL_TABLET | Freq: Every day | ORAL | Status: DC
  Administered 2021-03-18 – 2021-03-19 (×2): 1 via ORAL
  Filled 2021-03-18 (×2): qty 1

## 2021-03-18 MED ORDER — MORPHINE SULFATE (PF) 0.5 MG/ML IJ SOLN
INTRAMUSCULAR | Status: DC | PRN
  Administered 2021-03-18: .1 mg via INTRATHECAL

## 2021-03-18 MED ORDER — IBUPROFEN 600 MG PO TABS: 600.0000 mg | ORAL_TABLET | Freq: Four times a day (QID) | ORAL | Status: DC

## 2021-03-18 MED ORDER — MORPHINE SULFATE (PF) 2 MG/ML IV SOLN: 1.0000 mg | INTRAVENOUS | Status: DC | PRN

## 2021-03-18 MED ORDER — MENTHOL 3 MG MT LOZG
1.0000 | LOZENGE | OROMUCOSAL | Status: DC | PRN
  Filled 2021-03-18: qty 9

## 2021-03-18 MED ORDER — SOD CITRATE-CITRIC ACID 500-334 MG/5ML PO SOLN: 30.0000 mL | ORAL | Status: DC

## 2021-03-18 MED ORDER — OXYTOCIN-SODIUM CHLORIDE 30-0.9 UT/500ML-% IV SOLN
INTRAVENOUS | Status: AC
  Filled 2021-03-18: qty 500

## 2021-03-18 MED ORDER — ZOLPIDEM TARTRATE 5 MG PO TABS: 5.0000 mg | ORAL_TABLET | Freq: Every evening | ORAL | Status: DC | PRN

## 2021-03-18 MED ORDER — ONDANSETRON HCL 4 MG/2ML IJ SOLN
INTRAMUSCULAR | Status: DC | PRN
  Administered 2021-03-18: 4 mg via INTRAVENOUS

## 2021-03-18 MED ORDER — BUPIVACAINE HCL 0.5 % IJ SOLN
10.0000 mL | Freq: Once | INTRAMUSCULAR | Status: DC
  Filled 2021-03-18: qty 10

## 2021-03-18 MED ORDER — SERTRALINE HCL 25 MG PO TABS
50.0000 mg | ORAL_TABLET | Freq: Every day | ORAL | Status: DC
  Administered 2021-03-18 – 2021-03-19 (×2): 50 mg via ORAL
  Filled 2021-03-18 (×2): qty 2

## 2021-03-18 MED ORDER — BUPIVACAINE HCL (PF) 0.5 % IJ SOLN
INTRAMUSCULAR | Status: DC | PRN
  Administered 2021-03-18: 10 mL

## 2021-03-18 MED ORDER — ORAL CARE MOUTH RINSE: 15.0000 mL | Freq: Once | OROMUCOSAL | Status: DC

## 2021-03-18 MED ORDER — NALBUPHINE HCL 10 MG/ML IJ SOLN: 5.0000 mg | INTRAMUSCULAR | Status: DC | PRN

## 2021-03-18 MED ORDER — CHLORHEXIDINE GLUCONATE 0.12 % MT SOLN
15.0000 mL | Freq: Once | OROMUCOSAL | Status: DC
  Filled 2021-03-18: qty 15

## 2021-03-18 MED ORDER — LACTATED RINGERS IV SOLN
INTRAVENOUS | Status: DC
  Administered 2021-03-18: 500 mL via INTRAVENOUS

## 2021-03-18 MED ORDER — BUPIVACAINE HCL (PF) 0.5 % IJ SOLN
INTRAMUSCULAR | Status: AC
  Filled 2021-03-18: qty 30

## 2021-03-18 MED ORDER — COCONUT OIL OIL
1.0000 | TOPICAL_OIL | Status: DC | PRN
Start: 2021-03-18 — End: 2021-03-20

## 2021-03-18 MED ORDER — FENTANYL CITRATE (PF) 100 MCG/2ML IJ SOLN: INTRAMUSCULAR | Status: DC | PRN

## 2021-03-18 MED ORDER — DIPHENHYDRAMINE HCL 25 MG PO CAPS
25.0000 mg | ORAL_CAPSULE | ORAL | Status: DC | PRN
Start: 2021-03-18 — End: 2021-03-20

## 2021-03-18 MED ORDER — FENTANYL CITRATE (PF) 100 MCG/2ML IJ SOLN
INTRAMUSCULAR | Status: AC
  Filled 2021-03-18: qty 2

## 2021-03-18 MED ORDER — OXYCODONE-ACETAMINOPHEN 5-325 MG PO TABS
1.0000 | ORAL_TABLET | ORAL | Status: DC | PRN
  Administered 2021-03-19 (×2): 1 via ORAL
  Administered 2021-03-20 (×2): 2 via ORAL
  Filled 2021-03-18 (×2): qty 2
  Filled 2021-03-18 (×2): qty 1

## 2021-03-18 MED ORDER — BUPIVACAINE IN DEXTROSE 0.75-8.25 % IT SOLN
INTRATHECAL | Status: DC | PRN
  Administered 2021-03-18: 1.6 mL via INTRATHECAL

## 2021-03-18 MED ORDER — BUPIVACAINE 0.25 % ON-Q PUMP DUAL CATH 400 ML
400.0000 mL | INJECTION | Status: DC
  Filled 2021-03-18: qty 400

## 2021-03-18 MED ORDER — DIPHENHYDRAMINE HCL 50 MG/ML IJ SOLN: 12.5000 mg | INTRAMUSCULAR | Status: DC | PRN

## 2021-03-18 SURGICAL SUPPLY — 27 items
CATH KIT ON-Q SILVERSOAK 5 (CATHETERS) ×2 IMPLANT
CATH KIT ON-Q SILVERSOAK 5IN (CATHETERS) ×4 IMPLANT
CHLORAPREP W/TINT 26 (MISCELLANEOUS) ×4 IMPLANT
COVER WAND RF STERILE (DRAPES) ×2 IMPLANT
DERMABOND ADVANCED (GAUZE/BANDAGES/DRESSINGS) ×1
DERMABOND ADVANCED .7 DNX12 (GAUZE/BANDAGES/DRESSINGS) ×1 IMPLANT
ELECT CAUTERY BLADE 6.4 (BLADE) ×2 IMPLANT
ELECT REM PT RETURN 9FT ADLT (ELECTROSURGICAL) ×2
ELECTRODE REM PT RTRN 9FT ADLT (ELECTROSURGICAL) ×1 IMPLANT
EXTRACTOR VACUUM KIWI (MISCELLANEOUS) ×1 IMPLANT
GLOVE SKINSENSE NS SZ8.0 LF (GLOVE) ×1
GLOVE SKINSENSE STRL SZ8.0 LF (GLOVE) ×1 IMPLANT
GOWN STRL REUS W/ TWL LRG LVL3 (GOWN DISPOSABLE) ×1 IMPLANT
GOWN STRL REUS W/ TWL XL LVL3 (GOWN DISPOSABLE) ×2 IMPLANT
GOWN STRL REUS W/TWL LRG LVL3 (GOWN DISPOSABLE) ×1
GOWN STRL REUS W/TWL XL LVL3 (GOWN DISPOSABLE) ×2
MANIFOLD NEPTUNE II (INSTRUMENTS) ×2 IMPLANT
MAT PREVALON FULL STRYKER (MISCELLANEOUS) ×2 IMPLANT
NS IRRIG 1000ML POUR BTL (IV SOLUTION) ×2 IMPLANT
PACK C SECTION AR (MISCELLANEOUS) ×2 IMPLANT
PAD OB MATERNITY 4.3X12.25 (PERSONAL CARE ITEMS) ×2 IMPLANT
PAD PREP 24X41 OB/GYN DISP (PERSONAL CARE ITEMS) ×2 IMPLANT
PENCIL SMOKE EVACUATOR (MISCELLANEOUS) ×2 IMPLANT
SUT MAXON ABS #0 GS21 30IN (SUTURE) ×4 IMPLANT
SUT VIC AB 1 CT1 36 (SUTURE) ×7 IMPLANT
SUT VIC AB 2-0 CT1 36 (SUTURE) ×2 IMPLANT
SUT VIC AB 4-0 FS2 27 (SUTURE) ×2 IMPLANT

## 2021-03-18 NOTE — Transfer of Care (Signed)
Immediate Anesthesia Transfer of Care Note  Patient: Alicia [name]  Procedure(s) Performed: CESAREAN SECTION (N/A )  Patient Location: PACU and Mother/Baby  Anesthesia Type:Spinal  Level of Consciousness: awake, alert , oriented and patient cooperative  Airway & Oxygen Therapy: Patient Spontanous Breathing  Post-op Assessment: Report given to RN and Post -op Vital signs reviewed and stable  Post vital signs: Reviewed and stable  Last Vitals:  Vitals Value Taken Time  BP 94/62 0852  Temp    Pulse 75 0852  Resp 12 0852  SpO2 97 0852    Last Pain: There were no vitals filed for this visit.       Complications: No complications documented.

## 2021-03-18 NOTE — Op Note (Signed)
Cesarean Section Procedure Note Indications: prior cesarean section and term intrauterine pregnancy  Pre-operative Diagnosis: Intrauterine pregnancy [redacted]w[redacted]d ;  prior cesarean section Post-operative Diagnosis: same, delivered. Procedure: Low Transverse Cesarean Section Surgeon: Annamarie Major, MD, FACOG Assistant(s): Dr Thomasene Mohair, No other capable assistant available, in surgery requiring high level assistant. Anesthesia: Spinal anesthesia Estimated Blood Loss:420 mL Complications: None; patient tolerated the procedure well. Disposition: PACU - hemodynamically stable. Condition: stable  Findings: A female infant in the cephalic presentation. "Scarlett" Amniotic fluid - Other copious amount of clear fluid  Birth weight 8-14 lbs.  Apgars of 7 and 8 and 9.  Intact placenta with a three-vessel cord. Grossly normal uterus, tubes and ovaries bilaterally. No intraabdominal adhesions were noted.  Procedure Details   The patient was taken to Operating Room, identified as the correct patient and the procedure verified as C-Section Delivery. A Time Out was held and the above information confirmed. After induction of anesthesia, the patient was draped and prepped in the usual sterile manner. A Pfannenstiel incision was made and carried down through the subcutaneous tissue to the fascia. Fascial incision was made and extended transversely with the Mayo scissors. The fascia was separated from the underlying rectus tissue superiorly and inferiorly. The peritoneum was identified and entered bluntly. Peritoneal incision was extended longitudinally. The utero-vesical peritoneal reflection was incised transversely and a bladder flap was created digitally.  A low transverse hysterotomy was made. The fetus was delivered atraumatically. The umbilical cord was clamped x2 and cut and the infant was handed to the awaiting pediatricians. The placenta was removed intact and appeared normal with a 3-vessel cord.  The  uterus was exteriorized and cleared of all clot and debris. The hysterotomy was closed with running sutures of 0 Vicryl suture. A second imbricating layer was placed with the same suture. Excellent hemostasis was observed. The uterus was returned to the abdomen. The pelvis was irrigated and again, excellent hemostasis was noted.  The On Q Pain pump System was then placed.  Trocars were placed through the abdominal wall into the subfascial space and these were used to thread the silver soaker cathaters into place.The rectus fascia was then reapproximated with running sutures of Maxon, with careful placement not to incorporate the cathaters. Subcutaneous tissues are then irrigated with saline and hemostasis assured.  Skin is then closed with 4-0 vicryl suture in a subcuticular fashion followed by skin adhesive.  The surgical assistant Dr Jean Rosenthal performed tissue retraction, assistance with suturing, and fundal pressure.   The cathaters are flushed each with 5 mL of Bupivicaine and stabilized into place with dressing. Instrument, sponge, and needle counts were correct prior to the abdominal closure and at the conclusion of the case.  The patient tolerated the procedure well and was transferred to the recovery room in stable condition.   Annamarie Major, MD, [facility], [facility] 03/18/2021  8:49 AM

## 2021-03-18 NOTE — Progress Notes (Signed)
Day of Surgery Procedure(s) (LRB): CESAREAN SECTION (N/A)  Subjective: Patient reports incisional pain and she has an appetite.  Infant doing well.   .    Objective: I have reviewed patient's vital signs, intake and output, medications and labs.  Abd: Min T, ND Incision: clean, dry and intact Extr: no calf T, no edema  Assessment: s/p Procedure(s): CESAREAN SECTION (N/A): progressing well  Plan: Advance diet Encourage ambulation Advance to PO medication Infant doing well, bottle feeding plans  LOS: 0 days    Alicia [name] 03/18/2021, 11:41 AM

## 2021-03-18 NOTE — Discharge Summary (Signed)
Postpartum Discharge Summary    Patient Name: Alicia [name] DOB: 1991/08/24 MRN: [id number]  Date of admission: 03/18/2021 Delivery date:03/18/2021  Delivering provider: Gae Dry  Date of discharge: 03/20/2021  Admitting diagnosis: History of cesarean delivery [Z98.891] Intrauterine pregnancy: [redacted]w[redacted]d    Secondary diagnosis:  Active Problems:   History of cesarean delivery   Cesarean delivery delivered  Additional problems: None    Discharge diagnosis: Term Pregnancy Delivered                                              Post partum procedures:none Augmentation: N/A Complications: None  Hospital course: Sceduled C/S   30y.o. yo G2P1001 at 378w3das admitted to the hospital 03/18/2021 for scheduled cesarean section with the following indication:Elective Repeat.Delivery details are as follows:  Membrane Rupture Time/Date: 8:03 AM ,03/18/2021   Delivery Method:C-Section, Vacuum Assisted  Details of operation can be found in separate operative note.  Patient had an uncomplicated postpartum course.  She is ambulating, tolerating a regular diet, passing flatus, and urinating well. Patient is discharged home in stable condition on  03/20/21        Newborn Data: Birth date:03/18/2021  Birth time:8:03 AM  Gender:Female  Living status:Living  Apgars:7 ,8  Weight:4020 g     Magnesium Sulfate received: No BMZ received: No Rhophylac: Administered 03/19/21 MMR:No T-DaP:Given prenatally Flu: N/A Transfusion:No  Physical exam  Vitals:   03/19/21 0800 03/19/21 1611 03/19/21 2312 03/20/21 0754  BP: 122/90 (!) 100/59 96/61 107/72  Pulse: 86  89 78  Resp: 19 18 20 18   Temp: 98.2 F (36.8 C) 98 F (36.7 C) 98.6 F (37 C) 98.6 F (37 C)  TempSrc: Oral Oral Oral Oral  SpO2:  97% 97% 99%   General: alert, cooperative and no distress Lochia: appropriate Uterine Fundus: firm Incision: Healing well with no significant drainage, Dressing is clean, dry, and intact DVT Evaluation: No  evidence of DVT seen on physical exam. No significant calf/ankle edema. Labs: Lab Results  Component Value Date   WBC 8.3 03/19/2021   HGB 8.0 (L) 03/19/2021   HCT 24.4 (L) 03/19/2021   MCV 88.7 03/19/2021   PLT 129 (L) 03/19/2021   No flowsheet data found. Edinburgh Score: Edinburgh Postnatal Depression Scale Screening Tool 03/19/2021  I have been able to laugh and see the funny side of things. 0  I have looked forward with enjoyment to things. 1  I have blamed myself unnecessarily when things went wrong. 1  I have been anxious or worried for no good reason. 0  I have felt scared or panicky for no good reason. 0  Things have been getting on top of me. 1  I have been so unhappy that I have had difficulty sleeping. 1  I have felt sad or miserable. 1  I have been so unhappy that I have been crying. 1  The thought of harming myself has occurred to me. 0  Edinburgh Postnatal Depression Scale Total 6   After visit meds:  Allergies as of 03/20/2021   No Known Allergies     Medication List    STOP taking these medications   acetaminophen 325 MG tablet Commonly known as: TYLENOL     TAKE these medications   ibuprofen 600 MG tablet Commonly known as: ADVIL Take 1 tablet (600 mg total) by mouth every  6 (six) hours.   ondansetron 4 MG tablet Commonly known as: ZOFRAN Take 1 tablet (4 mg total) by mouth every 6 (six) hours as needed for nausea or vomiting.   oxyCODONE 5 MG immediate release tablet Commonly known as: Roxicodone Take 1 tablet (5 mg total) by mouth every 6 (six) hours as needed.   prenatal multivitamin Tabs tablet Take 1 tablet by mouth daily at 12 noon.   sertraline 50 MG tablet Commonly known as: Zoloft Take 1 tablet (50 mg total) by mouth daily.       Discharge home in stable condition Infant Feeding: Bottle Infant Disposition:home with mother Discharge instruction: per After Visit Summary and Postpartum booklet. Activity: Advance as tolerated. Pelvic  rest for 6 weeks.  Diet: routine diet Anticipated Birth Control: OCPs Postpartum Appointment:6 weeks Additional Postpartum F/U: Incision check 1 week Future Appointments: Future Appointments  Date Time Provider [place]  03/25/2021  1:50 PM Gae Dry, MD [facility] None   Follow up Visit:  Follow-up Information    Gae Dry, MD. Go in 1 week.   Specialty: Obstetrics and Gynecology Why: For Post Op, As Scheduled Contact information: [address] [place] [address] [phone]                03/20/2021 Orlie Pollen, CNM

## 2021-03-18 NOTE — Anesthesia Procedure Notes (Signed)
Spinal  Patient location during procedure: OR Start time: 03/18/2021 7:36 AM End time: 03/18/2021 7:45 AM Reason for block: surgical anesthesia Staffing Performed: anesthesiologist  Anesthesiologist: Molli Barrows, MD Resident/CRNA: Jonna Clark, CRNA Other anesthesia staff: Daiva Huge, RN Preanesthetic Checklist Completed: patient identified, IV checked, site marked, risks and benefits discussed, surgical consent, monitors and equipment checked, pre-op evaluation and timeout performed Spinal Block Patient position: sitting Prep: Betadine Patient monitoring: heart rate, continuous pulse ox, blood pressure and cardiac monitor Approach: midline Location: L4-5 Injection technique: single-shot Needle Needle type: Whitacre and Introducer  Needle gauge: 24 G Needle length: 9 cm Assessment Events: CSF return Additional Notes Negative paresthesia. Negative blood return. Positive free-flowing CSF. Expiration date of kit checked and confirmed. Patient tolerated procedure well, without complications.

## 2021-03-18 NOTE — Interval H&P Note (Signed)
History and Physical Interval Note:  03/18/2021 7:24 AM  Alicia [name]  has presented today for surgery, with the diagnosis of Prior CS, 39 weeks.  The various methods of treatment have been discussed with the patient and family. After consideration of risks, benefits and other options for treatment, the patient has consented to  Procedure(s): CESAREAN SECTION (N/A) as a surgical intervention.  The patient's history has been reviewed, patient examined, no change in status, stable for surgery.  I have reviewed the patient's chart and labs.  Questions were answered to the patient's satisfaction.     Letitia Libra

## 2021-03-19 LAB — CBC
HCT: 24.4 % — ABNORMAL LOW (ref 36.0–46.0)
Hemoglobin: 8 g/dL — ABNORMAL LOW (ref 12.0–15.0)
MCH: 29.1 pg (ref 26.0–34.0)
MCHC: 32.8 g/dL (ref 30.0–36.0)
MCV: 88.7 fL (ref 80.0–100.0)
Platelets: 129 10*3/uL — ABNORMAL LOW (ref 150–400)
RBC: 2.75 MIL/uL — ABNORMAL LOW (ref 3.87–5.11)
RDW: 14.5 % (ref 11.5–15.5)
WBC: 8.3 10*3/uL (ref 4.0–10.5)
nRBC: 0 % (ref 0.0–0.2)

## 2021-03-19 LAB — FETAL SCREEN: Fetal Screen: NEGATIVE

## 2021-03-19 MED ORDER — IBUPROFEN 600 MG PO TABS
600.0000 mg | ORAL_TABLET | Freq: Four times a day (QID) | ORAL | Status: DC
  Administered 2021-03-19 – 2021-03-20 (×5): 600 mg via ORAL
  Filled 2021-03-19 (×5): qty 1

## 2021-03-19 MED ORDER — ONDANSETRON HCL 4 MG PO TABS
4.0000 mg | ORAL_TABLET | Freq: Four times a day (QID) | ORAL | Status: DC | PRN
  Administered 2021-03-19 – 2021-03-20 (×3): 4 mg via ORAL
  Filled 2021-03-19 (×3): qty 1

## 2021-03-19 MED ORDER — RHO D IMMUNE GLOBULIN 1500 UNIT/2ML IJ SOSY
300.0000 ug | PREFILLED_SYRINGE | Freq: Once | INTRAMUSCULAR | Status: AC
  Administered 2021-03-19: 300 ug via INTRAVENOUS
  Filled 2021-03-19: qty 2

## 2021-03-19 NOTE — Anesthesia Post-op Follow-up Note (Signed)
  Anesthesia Pain Follow-up Note  Patient: Alicia [name] [facility]  Day #: 1  Date of Follow-up: 03/19/2021 Time: 9:22 AM  Last Vitals:  Vitals:   03/19/21 0700 03/19/21 0800  BP:  122/90  Pulse: 80 86  Resp:  19  Temp:  36.8 C  SpO2: 99%     Level of Consciousness: alert  Pain: mild   Side Effects:None  Catheter Site Exam:clean     Plan: D/C from anesthesia care at surgeon's request  [facility]

## 2021-03-19 NOTE — Anesthesia Postprocedure Evaluation (Signed)
Anesthesia Post Note  Patient: Corvette Orser Gann  Procedure(s) Performed: CESAREAN SECTION (N/A )  Patient location during evaluation: Mother Baby Anesthesia Type: Spinal Level of consciousness: awake, awake and alert and oriented Pain management: pain level controlled Vital Signs Assessment: post-procedure vital signs reviewed and stable Respiratory status: spontaneous breathing, respiratory function stable and nonlabored ventilation Cardiovascular status: blood pressure returned to baseline and stable Postop Assessment: no headache and no backache Anesthetic complications: no   No complications documented.   Last Vitals:  Vitals:   03/19/21 0700 03/19/21 0800  BP:  122/90  Pulse: 80 86  Resp:  19  Temp:  36.8 C  SpO2: 99%     Last Pain:  Vitals:   03/19/21 0800  TempSrc: Oral  PainSc:                  Ginger Carne

## 2021-03-19 NOTE — Progress Notes (Signed)
Admit Date: 03/18/2021 Today's Date: 03/19/2021  Subjective: Postpartum Day 1: Cesarean Delivery Patient reports tolerating PO and no problems voiding.   She has some cramping pain (uterine)  Objective: Vital signs in last 24 hours: Temp:  [97.9 F (36.6 C)-98.3 F (36.8 C)] 98.2 F (36.8 C) (05/04 0800) Pulse Rate:  [61-86] 86 (05/04 0800) Resp:  [14-20] 19 (05/04 0800) BP: (85-122)/(47-90) 122/90 (05/04 0800) SpO2:  [93 %-100 %] 99 % (05/04 0700)  Physical Exam:  General: alert, cooperative and no distress Lochia: appropriate Uterine Fundus: firm Incision: healing well, no significant drainage, no dehiscence, no significant erythema DVT Evaluation: No evidence of DVT seen on physical exam. Negative Homan's sign. No cords or calf tenderness. No significant calf/ankle edema.  Recent Labs    03/17/21 1050 03/19/21 0537  HGB 10.3* 8.0*  HCT 31.7* 24.4*    Assessment/Plan: Status post Cesarean section. Doing well postoperatively.  Continue current care. Bottle feeding.  Discussed breast care. Anemia, monitor for sx's, take iron Plans OCPs for birth control, start in 3 weeks  Letitia Libra 03/19/2021, 8:26 AM

## 2021-03-20 LAB — RHOGAM INJECTION: Unit division: 0

## 2021-03-20 MED ORDER — ONDANSETRON HCL 4 MG PO TABS: 4.0000 mg | ORAL_TABLET | Freq: Four times a day (QID) | ORAL | 0 refills | Status: DC | PRN

## 2021-03-20 MED ORDER — OXYCODONE HCL 5 MG PO TABS: 5.0000 mg | ORAL_TABLET | Freq: Four times a day (QID) | ORAL | 0 refills | Status: DC | PRN

## 2021-03-20 MED ORDER — IBUPROFEN 600 MG PO TABS: 600.0000 mg | ORAL_TABLET | Freq: Four times a day (QID) | ORAL | 0 refills | Status: DC

## 2021-03-20 NOTE — Progress Notes (Signed)
Mother discharged.  Discharge instructions given.  Mother verbalizes understanding.  Transported by auxiliary.  

## 2021-03-20 NOTE — Discharge Instructions (Signed)
Cesarean Delivery, Care After This sheet gives you information about how to care for yourself after your procedure. Your health care provider may also give you more specific instructions. If you have problems or questions, contact your health care provider. What can I expect after the procedure? After the procedure, it is common to have:  A small amount of blood or clear fluid coming from the incision.  Some redness, swelling, and pain in your incision area.  Some abdominal pain and soreness.  Vaginal bleeding (lochia). Even though you did not have a vaginal delivery, you will still have vaginal bleeding and discharge.  Pelvic cramps.  Fatigue. You may have pain, swelling, and discomfort in the tissue between your vagina and your anus (perineum) if:  Your C-section was unplanned, and you were allowed to labor and push.  An incision was made in the area (episiotomy) or the tissue tore during attempted vaginal delivery. Follow these instructions at home: Incision care  Follow instructions from your health care provider about how to take care of your incision. Make sure you: ? Wash your hands with soap and water before you change your bandage (dressing). If soap and water are not available, use hand sanitizer. ? If you have a dressing, change it or remove it as told by your health care provider. ? Leave stitches (sutures), skin staples, skin glue, or adhesive strips in place. These skin closures may need to stay in place for 2 weeks or longer. If adhesive strip edges start to loosen and curl up, you may trim the loose edges. Do not remove adhesive strips completely unless your health care provider tells you to do that.  Check your incision area every day for signs of infection. Check for: ? More redness, swelling, or pain. ? More fluid or blood. ? Warmth. ? Pus or a bad smell.  Do not take baths, swim, or use a hot tub until your health care provider says it's okay. Ask your health  care provider if you can take showers.  When you cough or sneeze, hug a pillow. This helps with pain and decreases the chance of your incision opening up (dehiscing). Do this until your incision heals.   Medicines  Take over-the-counter and prescription medicines only as told by your health care provider.  If you were prescribed an antibiotic medicine, take it as told by your health care provider. Do not stop taking the antibiotic even if you start to feel better.  Do not drive or use heavy machinery while taking prescription pain medicine. Lifestyle  Do not drink alcohol. This is especially important if you are breastfeeding or taking pain medicine.  Do not use any products that contain nicotine or tobacco, such as cigarettes, e-cigarettes, and chewing tobacco. If you need help quitting, ask your health care provider. Eating and drinking  Drink at least 8 eight-ounce glasses of water every day unless told not to by your health care provider. If you breastfeed, you may need to drink even more water.  Eat high-fiber foods every day. These foods may help prevent or relieve constipation. High-fiber foods include: ? Whole grain cereals and breads. ? Brown rice. ? Beans. ? Fresh fruits and vegetables. Activity  If possible, have someone help you care for your baby and help with household activities for at least a few days after you leave the hospital.  Return to your normal activities as told by your health care provider. Ask your health care provider what activities are safe for   you.  Rest as much as possible. Try to rest or take a nap while your baby is sleeping.  Do not lift anything that is heavier than 10 lbs (4.5 kg), or the limit that you were told, until your health care provider says that it is safe.  Talk with your health care provider about when you can engage in sexual activity. This may depend on your: ? Risk of infection. ? How fast you heal. ? Comfort and desire to  engage in sexual activity.   General instructions  Do not use tampons or douches until your health care provider approves.  Wear loose, comfortable clothing and a supportive and well-fitting bra.  Keep your perineum clean and dry. Wipe from front to back when you use the toilet.  If you pass a blood clot, save it and call your health care provider to discuss. Do not flush blood clots down the toilet before you get instructions from your health care provider.  Keep all follow-up visits for you and your baby as told by your health care provider. This is important. Contact a health care provider if:  You have: ? A fever. ? Bad-smelling vaginal discharge. ? Pus or a bad smell coming from your incision. ? Difficulty or pain when urinating. ? A sudden increase or decrease in the frequency of your bowel movements. ? More redness, swelling, or pain around your incision. ? More fluid or blood coming from your incision. ? A rash. ? Nausea. ? Little or no interest in activities you used to enjoy. ? Questions about caring for yourself or your baby.  Your incision feels warm to the touch.  Your breasts turn red or become painful or hard.  You feel unusually sad or worried.  You vomit.  You pass a blood clot from your vagina.  You urinate more than usual.  You are dizzy or light-headed. Get help right away if:  You have: ? Pain that does not go away or get better with medicine. ? Chest pain. ? Difficulty breathing. ? Blurred vision or spots in your vision. ? Thoughts about hurting yourself or your baby. ? New pain in your abdomen or in one of your legs. ? A severe headache.  You faint.  You bleed from your vagina so much that you fill more than one sanitary pad in one hour. Bleeding should not be heavier than your heaviest period. Summary  After the procedure, it is common to have pain at your incision site, abdominal cramping, and slight bleeding from your vagina.  Check  your incision area every day for signs of infection.  Tell your health care provider about any unusual symptoms.  Keep all follow-up visits for you and your baby as told by your health care provider. This information is not intended to replace advice given to you by your health care provider. Make sure you discuss any questions you have with your health care provider. Document Revised: 05/11/2018 Document Reviewed: 05/11/2018 Elsevier Patient Education  2021 [employer]  

## 2021-03-21 ENCOUNTER — Encounter: Payer: Self-pay | Admitting: Obstetrics & Gynecology

## 2021-03-21 NOTE — Anesthesia Preprocedure Evaluation (Signed)
Anesthesia Evaluation  Patient identified by MRN, date of birth, ID band Patient awake    Reviewed: Allergy & Precautions, H&P , NPO status , Patient's Chart, lab work & pertinent test results  History of Anesthesia Complications Negative for: history of anesthetic complications  Airway Mallampati: III  TM Distance: >3 FB Neck ROM: full    Dental  (+) Chipped   Pulmonary neg pulmonary ROS, neg shortness of breath,    Pulmonary exam normal        Cardiovascular Exercise Tolerance: Good negative cardio ROS Normal cardiovascular exam     Neuro/Psych    GI/Hepatic negative GI ROS,   Endo/Other    Renal/GU   negative genitourinary   Musculoskeletal   Abdominal   Peds  Hematology negative hematology ROS (+)   Anesthesia Other Findings Past Medical History: No date: Anemia No date: Anxiety No date: Depression No date: Family history of breast cancer     Comment:  9/21 cancer genetic testing letter sent No date: Family history of ovarian cancer  Past Surgical History: 08/30/2015: CESAREAN SECTION; N/A     Comment:  Procedure: CESAREAN SECTION;  Surgeon: Nadara Mustard,               MD;  Location: [facility] ORS;  Service: Obstetrics;                Laterality: N/A; 03/18/2021: CESAREAN SECTION; N/A     Comment:  Procedure: CESAREAN SECTION;  Surgeon: Nadara Mustard,              MD;  Location: [facility] ORS;  Service: Obstetrics;                Laterality: N/A;     Reproductive/Obstetrics (+) Pregnancy                             Anesthesia Physical Anesthesia Plan  ASA: II  Anesthesia Plan: Spinal   Post-op Pain Management:    Induction:   PONV Risk Score and Plan:   Airway Management Planned: Natural Airway and Nasal Cannula  Additional Equipment:   Intra-op Plan:   Post-operative Plan:   Informed Consent: I have reviewed the patients History and Physical, chart, labs and  discussed the procedure including the risks, benefits and alternatives for the proposed anesthesia with the patient or authorized representative who has indicated his/her understanding and acceptance.     Dental Advisory Given  Plan Discussed with: Anesthesiologist, CRNA and Surgeon  Anesthesia Plan Comments: (Patient reports no bleeding problems and no anticoagulant use.  Plan for spinal with backup GA  Patient consented for risks of anesthesia including but not limited to:  - adverse reactions to medications - damage to eyes, teeth, lips or other oral mucosa - nerve damage due to positioning  - risk of bleeding, infection and or nerve damage from spinal that could lead to paralysis - risk of headache or failed spinal - damage to teeth, lips or other oral mucosa - sore throat or hoarseness - damage to heart, brain, nerves, lungs, other parts of body or loss of life  Patient voiced understanding.)        Anesthesia Quick Evaluation

## 2021-03-25 ENCOUNTER — Ambulatory Visit (INDEPENDENT_AMBULATORY_CARE_PROVIDER_SITE_OTHER): Payer: No Typology Code available for payment source | Admitting: Obstetrics & Gynecology

## 2021-03-25 ENCOUNTER — Other Ambulatory Visit: Payer: Self-pay

## 2021-03-25 ENCOUNTER — Encounter: Payer: Self-pay | Admitting: Obstetrics & Gynecology

## 2021-03-25 VITALS — BP 100/70 | Ht 62.0 in | Wt 160.0 lb

## 2021-03-25 DIAGNOSIS — Z98891 History of uterine scar from previous surgery: Secondary | ICD-10-CM

## 2021-03-25 NOTE — Progress Notes (Signed)
  Postoperative Follow-up Patient presents post op from recent Cesarean Section performed for Elective repeat and BTL, 1 week ago.   Subjective: Patient reports some improvement in her immediate post op symptoms. Eating a regular diet without difficulty. Pain is controlled with current analgesics. Medications being used: ibuprofen (OTC) and narcotic analgesics including Percocet.  Activity: normal activities of daily living. Patient reports additional symptom's since surgery of appropriate lochia, no signs of depression, and no signs of mastitis.  Objective: BP 100/70   Ht 5\' 2"  (1.575 m)   Wt 160 lb (72.6 kg)   BMI 29.26 kg/m  Physical Exam Constitutional:      General: She is not in acute distress.    Appearance: She is well-developed.  Cardiovascular:     Rate and Rhythm: Normal rate.  Pulmonary:     Effort: Pulmonary effort is normal.  Abdominal:     General: There is no distension.     Palpations: Abdomen is soft.     Tenderness: There is no abdominal tenderness.     Comments: Incision Healing Well   Musculoskeletal:        General: Normal range of motion.  Neurological:     Mental Status: She is alert and oriented to person, place, and time.     Cranial Nerves: No cranial nerve deficit.  Skin:    General: Skin is warm and dry.   On Q removed  Assessment: s/p : Cesarean Section for Elective repeat and also Tubal for sterility stable  Plan: Patient has done well after her Cesarean Section with no apparent complications.  I have discussed the post-operative course to date, and the expected progress moving forward.  The patient understands what complications to be concerned about.  I will see the patient in routine follow up, or sooner if needed.    Activity plan: No heavy lifting.  Pelvic rest.  Marland Kitchen 03/25/2021, 2:14 PM

## 2021-03-26 ENCOUNTER — Other Ambulatory Visit: Payer: Self-pay

## 2021-03-28 MED ORDER — ONDANSETRON HCL 4 MG PO TABS: 4.0000 mg | ORAL_TABLET | Freq: Four times a day (QID) | ORAL | 0 refills | Status: DC | PRN

## 2021-03-28 MED ORDER — IBUPROFEN 600 MG PO TABS: 600.0000 mg | ORAL_TABLET | Freq: Four times a day (QID) | ORAL | 0 refills | Status: DC

## 2021-03-28 MED ORDER — OXYCODONE HCL 5 MG PO TABS: 5.0000 mg | ORAL_TABLET | Freq: Four times a day (QID) | ORAL | 0 refills | Status: DC | PRN

## 2021-04-29 ENCOUNTER — Encounter: Payer: Self-pay | Admitting: Obstetrics & Gynecology

## 2021-04-29 ENCOUNTER — Ambulatory Visit (INDEPENDENT_AMBULATORY_CARE_PROVIDER_SITE_OTHER): Payer: No Typology Code available for payment source | Admitting: Obstetrics & Gynecology

## 2021-04-29 ENCOUNTER — Other Ambulatory Visit: Payer: Self-pay

## 2021-04-29 MED ORDER — LO LOESTRIN FE 1 MG-10 MCG / 10 MCG PO TABS: 1.0000 | ORAL_TABLET | Freq: Every day | ORAL | 3 refills | Status: DC

## 2021-04-29 NOTE — Progress Notes (Signed)
  OBSTETRICS POSTPARTUM CLINIC PROGRESS NOTE  Subjective:     Winifred Bodiford is a 30 y.o. [id number] female who presents for a postpartum visit. She is 6 weeks postpartum following a Term pregnancy and delivery by C-section.  I have fully reviewed the prenatal and intrapartum course. Anesthesia: spinal.  Postpartum course has been complicated by uncomplicated.  Baby is feeding by Bottle.  Bleeding: patient has not  resumed menses.  Bowel function is normal. Bladder function is normal.  Patient is not sexually active. Contraception method desired is OCP (estrogen/progesterone).  Postpartum depression screening: negative. [place] 9.  The following portions of the patient's history were reviewed and updated as appropriate: allergies, current medications, past family history, past medical history, past social history, past surgical history, and problem list.  Review of Systems Pertinent items are noted in HPI.  Objective:    BP 100/60   Ht 5\' 2"  (1.575 m)   Wt 144 lb (65.3 kg)   BMI 26.34 kg/m   General:  alert and no distress   Breasts:  inspection negative, no nipple discharge or bleeding, no masses or nodularity palpable  Lungs: clear to auscultation bilaterally  Heart:  regular rate and rhythm, S1, S2 normal, no murmur, click, rub or gallop  Abdomen: soft, non-tender; bowel sounds normal; no masses,  no organomegaly.  Well healed Pfannenstiel incision   Vulva:  normal  Vagina: normal vagina, no discharge, exudate, lesion, or erythema  Cervix:  no cervical motion tenderness and no lesions  Corpus: normal size, contour, position, consistency, mobility, non-tender  Adnexa:  normal adnexa and no mass, fullness, tenderness  Rectal Exam: Not performed.          Assessment:  Post Partum Care visit 1. Postpartum care and examination No worsening PPD, feels it is improving on Zoloft Plans to come off after 12 weeks  PAP due in Sept  Plan:  See orders and Patient  Instructions Follow up in: 3 months or as needed.   OCPs The risks /benefits of OCPs have been explained to the patient in detail.  Product literature has been given to her.  I have instructed her in the use of OCPs and have given her literature reinforcing this information.  I have explained to the patient that OCPs are not as effective for birth control during the first month of use, and that another form of contraception should be used during this time.  Both first-day start and Sunday start have been explained.  The risks and benefits of each was discussed.  She has been made aware of  the fact that other medications may affect the efficacy of OCPs.  I have answered all of her questions, and I believe that she has an understanding of the effectiveness and use of OCPs.  Friday, MD, [facility], [facility] 04/29/2021  2:19 PM

## 2021-06-21 ENCOUNTER — Telehealth: Payer: No Typology Code available for payment source | Admitting: Nurse Practitioner

## 2021-06-21 DIAGNOSIS — J069 Acute upper respiratory infection, unspecified: Secondary | ICD-10-CM | POA: Diagnosis not present

## 2021-06-21 MED ORDER — FLUTICASONE PROPIONATE 50 MCG/ACT NA SUSP: 2.0000 | Freq: Every day | NASAL | 0 refills | Status: DC

## 2021-06-21 MED ORDER — BENZONATATE 100 MG PO CAPS: 100.0000 mg | ORAL_CAPSULE | Freq: Three times a day (TID) | ORAL | 0 refills | Status: DC | PRN

## 2021-06-21 NOTE — Progress Notes (Signed)
E-Visit for Upper Respiratory Infection   We are sorry you are not feeling well.  Here is how we plan to help!  Based on what you have shared with me, it looks like you may have a viral upper respiratory infection.  Upper respiratory infections are caused by a large number of viruses; however, rhinovirus is the most common cause.   Upper respiratory infections do not require antibiotic use, they may progress to secondary sinus infections but this typically takes between 7-10 days of consistent congestion. The best thing to do during these stages is to manage symptoms.   Symptoms vary from person to person, with common symptoms including sore throat, cough, fatigue or lack of energy and feeling of general discomfort.  A low-grade fever of up to 100.4 may present, but is often uncommon.  Symptoms vary however, and are closely related to a person's age or underlying illnesses.  The most common symptoms associated with an upper respiratory infection are nasal discharge or congestion, cough, sneezing, headache and pressure in the ears and face.  These symptoms usually persist for about 3 to 10 days, but can last up to 2 weeks.  It is important to know that upper respiratory infections do not cause serious illness or complications in most cases.    Upper respiratory infections can be transmitted from person to person, with the most common method of transmission being a person's hands.  The virus is able to live on the skin and can infect other persons for up to 2 hours after direct contact.  Also, these can be transmitted when someone coughs or sneezes; thus, it is important to cover the mouth to reduce this risk.  To keep the spread of the illness at bay, good hand hygiene is very important.  This is an infection that is most likely caused by a virus. There are no specific treatments other than to help you with the symptoms until the infection runs its course.  We are sorry you are not feeling well.  Here is  how we plan to help!   For nasal congestion, you may use an oral decongestants such as Mucinex D or if you have glaucoma or high blood pressure use plain Mucinex.  Saline nasal spray or nasal drops can help and can safely be used as often as needed for congestion.  For your congestion, I have prescribed Fluticasone nasal spray one spray in each nostril twice a day  If you do not have a history of heart disease, hypertension, diabetes or thyroid disease, prostate/bladder issues or glaucoma, you may also use Sudafed to treat nasal congestion.  It is highly recommended that you consult with a pharmacist or your primary care physician to ensure this medication is safe for you to take.     If you have a cough, you may use cough suppressants such as Delsym and Robitussin.  If you have glaucoma or high blood pressure, you can also use Coricidin HBP.   For cough I have prescribed for you A prescription cough medication called Tessalon Perles 100 mg. You may take 1-2 capsules every 8 hours as needed for cough  If you have a sore or scratchy throat, use a saltwater gargle-  to  teaspoon of salt dissolved in a 4-ounce to 8-ounce glass of warm water.  Gargle the solution for approximately 15-30 seconds and then spit.  It is important not to swallow the solution.  You can also use throat lozenges/cough drops and Chloraseptic spray to  help with throat pain or discomfort.  Warm or cold liquids can also be helpful in relieving throat pain.  For headache, pain or general discomfort, you can use Ibuprofen or Tylenol as directed.   Some authorities believe that zinc sprays or the use of Echinacea may shorten the course of your symptoms.   HOME CARE Only take medications as instructed by your medical team. Be sure to drink plenty of fluids. Water is fine as well as fruit juices, sodas and electrolyte beverages. You may want to stay away from caffeine or alcohol. If you are nauseated, try taking small sips of  liquids. How do you know if you are getting enough fluid? Your urine should be a pale yellow or almost colorless. Get rest. Taking a steamy shower or using a humidifier may help nasal congestion and ease sore throat pain. You can place a towel over your head and breathe in the steam from hot water coming from a faucet. Using a saline nasal spray works much the same way. Cough drops, hard candies and sore throat lozenges may ease your cough. Avoid close contacts especially the very young and the elderly Cover your mouth if you cough or sneeze Always remember to wash your hands.   GET HELP RIGHT AWAY IF: You develop worsening fever. If your symptoms do not improve within 10 days You develop yellow or green discharge from your nose over 3 days. You have coughing fits You develop a severe head ache or visual changes. You develop shortness of breath, difficulty breathing or start having chest pain Your symptoms persist after you have completed your treatment plan  MAKE SURE YOU  Understand these instructions. Will watch your condition. Will get help right away if you are not doing well or get worse.  Thank you for choosing an e-visit.  Your e-visit answers were reviewed by a board certified advanced clinical practitioner to complete your personal care plan. Depending upon the condition, your plan could have included both over the counter or prescription medications.  Please review your pharmacy choice. Make sure the pharmacy is open so you can pick up prescription now. If there is a problem, you may contact your provider through [employer] and have the prescription routed to another pharmacy.  Your safety is important to [place]. If you have drug allergies check your prescription carefully.   For the next 24 hours you can use MyChart to ask questions about today's visit, request a non-urgent call back, or ask for a work or school excuse. You will get an email in the next two days asking  about your experience. I hope that your e-visit has been valuable and will speed your recovery.  I spent approximately 7 minutes reviewing the patient's history, current symptoms and coordinating their plan of care today.     Meds ordered this encounter  Medications   fluticasone (FLONASE) 50 MCG/ACT nasal spray    Sig: Place 2 sprays into both nostrils daily.    Dispense:  16 g    Refill:  0   benzonatate (TESSALON) 100 MG capsule    Sig: Take 1 capsule (100 mg total) by mouth 3 (three) times daily as needed for cough.    Dispense:  30 capsule    Refill:  0

## 2021-06-27 ENCOUNTER — Encounter (HOSPITAL_COMMUNITY): Payer: Self-pay

## 2021-06-27 ENCOUNTER — Ambulatory Visit (HOSPITAL_COMMUNITY)
Admission: EM | Admit: 2021-06-27 | Discharge: 2021-06-27 | Disposition: A | Discharge status: Home / Self Care | Payer: No Typology Code available for payment source | Attending: Physician Assistant | Admitting: Physician Assistant

## 2021-06-27 ENCOUNTER — Other Ambulatory Visit: Payer: Self-pay

## 2021-06-27 DIAGNOSIS — J40 Bronchitis, not specified as acute or chronic: Secondary | ICD-10-CM | POA: Diagnosis not present

## 2021-06-27 DIAGNOSIS — R059 Cough, unspecified: Secondary | ICD-10-CM

## 2021-06-27 DIAGNOSIS — J329 Chronic sinusitis, unspecified: Secondary | ICD-10-CM

## 2021-06-27 MED ORDER — PREDNISONE 20 MG PO TABS: 40.0000 mg | ORAL_TABLET | Freq: Every day | ORAL | 0 refills | Status: AC

## 2021-06-27 MED ORDER — AMOXICILLIN-POT CLAVULANATE 875-125 MG PO TABS: 1.0000 | ORAL_TABLET | Freq: Two times a day (BID) | ORAL | 0 refills | Status: DC

## 2021-06-27 NOTE — ED Triage Notes (Signed)
Pt presents with non productive cough, sore throat, and congestion X 1 week.

## 2021-06-27 NOTE — Discharge Instructions (Addendum)
We are going to treat you for sinus infection given you have had symptoms for a week and a half.  Take Augmentin twice daily for 7 days.  While you are taking antibiotics it can decrease the effectiveness of your birth control so please use backup birth control while taking antibiotics.  I have called in prednisone 40 mg for 3 days.  This should help with your symptoms.  You should not take NSAIDs including aspirin, ibuprofen/Advil, naproxen/Aleve with this medication as it can cause stomach bleeding.  I would recommend that you use Tylenol for pain and fever relief.  You can use Mucinex and Flonase for additional symptom relief.  If you have any worsening symptoms you need to return for reevaluation.

## 2021-06-27 NOTE — ED Provider Notes (Signed)
[facility]    CSN: [id number] Arrival date & time: 06/27/21  0827      History   Chief Complaint Chief Complaint  Patient presents with   Cough   Sore Throat    HPI Alicia [name] is a 30 y.o. female.   Patient presents today with a 9-day history of URI symptoms that have gradually been worsening.  Reports initially she was experiencing low-grade fever, sore throat, nasal congestion, drainage, nausea but the symptoms have improved though she continues to have significant cough and sinus pressure.  She denies any chest pain, shortness of breath, nausea/vomiting interfering with oral intake, dizziness.  She does report household sick contacts with similar symptoms.  She took an at-home COVID test several times during course of illness that was negative.  Reports she is up-to-date on COVID-19 vaccinations and flu shot.  Denies any recent antibiotic use.  She does have a history of mild seasonal allergies only requiring antihistamines during the spring and states current symptoms are not similar to previous episodes of this condition.  Denies history of asthma, COPD, smoking.   Past Medical History:  Diagnosis Date   Anemia    Anxiety    Depression    Family history of breast cancer    9/21 cancer genetic testing letter sent   Family history of ovarian cancer     Patient Active Problem List   Diagnosis Date Noted   History of cesarean delivery 03/18/2021   S/P cesarean section 08/30/2015   Family history of genetic disease carrier 03/25/2015    Past Surgical History:  Procedure Laterality Date   CESAREAN SECTION N/A 08/30/2015   Procedure: CESAREAN SECTION;  Surgeon: Nadara Mustard, MD;  Location: [facility];  Service: Obstetrics;  Laterality: N/A;   CESAREAN SECTION N/A 03/18/2021   Procedure: CESAREAN SECTION;  Surgeon: Nadara Mustard, MD;  Location: [facility];  Service: Obstetrics;  Laterality: N/A;    OB History     Gravida  2   Para  1   Term  1    Preterm  0   AB  0   Living  1      SAB  0   IAB  0   Ectopic  0   Multiple      Live Births  1            Home Medications    Prior to Admission medications   Medication Sig Start Date End Date Taking? Authorizing Provider  amoxicillin-clavulanate (AUGMENTIN) 875-125 MG tablet Take 1 tablet by mouth every 12 (twelve) hours. 06/27/21  Yes Defne Gerling K, PA-C  predniSONE (DELTASONE) 20 MG tablet Take 2 tablets (40 mg total) by mouth daily for 3 days. 06/27/21 06/30/21 Yes Xavior Niazi K, PA-C  benzonatate (TESSALON) 100 MG capsule Take 1 capsule (100 mg total) by mouth 3 (three) times daily as needed for cough. 06/21/21   Viviano Simas, FNP  fluticasone (FLONASE) 50 MCG/ACT nasal spray Place 2 sprays into both nostrils daily. 06/21/21   Viviano Simas, FNP  Norethindrone-Ethinyl Estradiol-Fe Biphas (LO LOESTRIN FE) 1 MG-10 MCG / 10 MCG tablet Take 1 tablet by mouth daily. 04/29/21 07/22/21  Nadara Mustard, MD  sertraline (ZOLOFT) 50 MG tablet Take 1 tablet (50 mg total) by mouth daily. 12/12/20   Mirna Mires, CNM    Family History Family History  Problem Relation Age of Onset   Pancreatic cancer Maternal Grandfather    Ovarian cancer Paternal  Grandmother    Breast cancer Paternal Aunt 64   Ovarian cancer Cousin 30    Social History Social History   Tobacco Use   Smoking status: Never   Smokeless tobacco: Never  Vaping Use   Vaping Use: Never used  Substance Use Topics   Alcohol use: No   Drug use: No     Allergies   Patient has no known allergies.   Review of Systems Review of Systems  Constitutional:  Positive for activity change and fatigue. Negative for appetite change and fever (improved).  HENT:  Positive for congestion, postnasal drip, sinus pressure and sore throat. Negative for sneezing.   Respiratory:  Positive for cough. Negative for shortness of breath.   Gastrointestinal:  Positive for nausea. Negative for abdominal pain, diarrhea and  vomiting.  Musculoskeletal:  Positive for arthralgias and myalgias.  Neurological:  Positive for headaches. Negative for dizziness and light-headedness.    Physical Exam Triage Vital Signs ED Triage Vitals  Enc Vitals Group     BP 06/27/21 0931 111/76     Pulse Rate 06/27/21 0931 80     Resp 06/27/21 0931 17     Temp 06/27/21 0931 98.8 F (37.1 C)     Temp Source 06/27/21 0931 Oral     SpO2 06/27/21 0931 100 %     Weight --      Height --      Head Circumference --      Peak Flow --      Pain Score 06/27/21 0929 6     Pain Loc --      Pain Edu? --      Excl. in [place]? --    No data found.  Updated Vital Signs BP 111/76 (BP Location: Left Arm)   Pulse 80   Temp 98.8 F (37.1 C) (Oral)   Resp 17   LMP 06/21/2021   SpO2 100%   Visual Acuity Right Eye Distance:   Left Eye Distance:   Bilateral Distance:    Right Eye Near:   Left Eye Near:    Bilateral Near:     Physical Exam Vitals reviewed.  Constitutional:      General: She is awake. She is not in acute distress.    Appearance: Normal appearance. She is normal weight. She is not ill-appearing.     Comments: Very pleasant female appears stated age in no acute distress sitting comfortably in exam room  HENT:     Head: Normocephalic and atraumatic.     Right Ear: Ear canal and external ear normal. A middle ear effusion is present. Tympanic membrane is not erythematous or bulging.     Left Ear: Tympanic membrane, ear canal and external ear normal.  No middle ear effusion. Tympanic membrane is not erythematous or bulging.     Nose:     Right Sinus: Maxillary sinus tenderness present. No frontal sinus tenderness.     Left Sinus: Maxillary sinus tenderness present. No frontal sinus tenderness.     Mouth/Throat:     Pharynx: Uvula midline. No oropharyngeal exudate or posterior oropharyngeal erythema.     Comments: Mild drainage posterior oropharynx Cardiovascular:     Rate and Rhythm: Normal rate and regular rhythm.      Heart sounds: Normal heart sounds, S1 normal and S2 normal. No murmur heard. Pulmonary:     Effort: Pulmonary effort is normal.     Breath sounds: Normal breath sounds. No wheezing, rhonchi or rales.  Comments: Clear to auscultation bilaterally Lymphadenopathy:     Head:     Right side of head: No submental, submandibular or tonsillar adenopathy.     Left side of head: No submental, submandibular or tonsillar adenopathy.     Cervical: No cervical adenopathy.  Psychiatric:        Behavior: Behavior is cooperative.     UC Treatments / Results  Labs (all labs ordered are listed, but only abnormal results are displayed) Labs Reviewed - No data to display  EKG   Radiology No results found.  Procedures Procedures (including critical care time)  Medications Ordered in UC Medications - No data to display  Initial Impression / Assessment and Plan / UC Course  I have reviewed the triage vital signs and the nursing notes.  Pertinent labs & imaging results that were available during my care of the patient were reviewed by me and considered in my medical decision making (see chart for details).      No indication for viral testing given patient has been symptomatic for more than a week and a half.  We will treat for sinobronchitis.  Patient was prescribed Augmentin to be taken twice daily for 7 days.  Discussed that this can decrease the effectiveness of birth control so she should backup birth control while on this medication.  She was prescribed prednisone with instruction not to take NSAIDs with this medication due to risk of GI bleeding.  Encouraged her to use over-the-counter medications including Mucinex and Flonase.  Recommended she rest and drink plenty of fluid.  Discussed alarm symptoms that warrant emergent evaluation.  Strict return precautions given to which patient expressed understanding.  Final Clinical Impressions(s) / UC Diagnoses   Final diagnoses:  Cough   Sinobronchitis     Discharge Instructions      We are going to treat you for sinus infection given you have had symptoms for a week and a half.  Take Augmentin twice daily for 7 days.  While you are taking antibiotics it can decrease the effectiveness of your birth control so please use backup birth control while taking antibiotics.  I have called in prednisone 40 mg for 3 days.  This should help with your symptoms.  You should not take NSAIDs including aspirin, ibuprofen/Advil, naproxen/Aleve with this medication as it can cause stomach bleeding.  I would recommend that you use Tylenol for pain and fever relief.  You can use Mucinex and Flonase for additional symptom relief.  If you have any worsening symptoms you need to return for reevaluation.     ED Prescriptions     Medication Sig Dispense Auth. Provider   predniSONE (DELTASONE) 20 MG tablet Take 2 tablets (40 mg total) by mouth daily for 3 days. 6 tablet Halee Glynn K, PA-C   amoxicillin-clavulanate (AUGMENTIN) 875-125 MG tablet Take 1 tablet by mouth every 12 (twelve) hours. 14 tablet Oseas Detty, Noberto Retort, PA-C      PDMP not reviewed this encounter.   Jeani Hawking, PA-C 06/27/21 1022

## 2021-07-31 ENCOUNTER — Ambulatory Visit: Payer: No Typology Code available for payment source | Admitting: Obstetrics & Gynecology

## 2021-08-14 ENCOUNTER — Ambulatory Visit: Payer: No Typology Code available for payment source | Admitting: Obstetrics & Gynecology

## 2021-09-04 ENCOUNTER — Telehealth: Payer: No Typology Code available for payment source | Admitting: Physician Assistant

## 2021-09-04 DIAGNOSIS — U071 COVID-19: Secondary | ICD-10-CM

## 2021-09-04 MED ORDER — FLUTICASONE PROPIONATE 50 MCG/ACT NA SUSP: 2.0000 | Freq: Every day | NASAL | 0 refills | Status: DC

## 2021-09-04 MED ORDER — BENZONATATE 100 MG PO CAPS: 100.0000 mg | ORAL_CAPSULE | Freq: Three times a day (TID) | ORAL | 0 refills | Status: DC | PRN

## 2021-09-04 MED ORDER — PROMETHAZINE-DM 6.25-15 MG/5ML PO SYRP: 5.0000 mL | ORAL_SOLUTION | Freq: Four times a day (QID) | ORAL | 0 refills | Status: DC | PRN

## 2021-09-04 MED ORDER — ONDANSETRON HCL 4 MG PO TABS: 4.0000 mg | ORAL_TABLET | Freq: Three times a day (TID) | ORAL | 0 refills | Status: DC | PRN

## 2021-09-04 NOTE — Progress Notes (Signed)
E-Visit  for Positive Covid Test Result  We are sorry you are not feeling well. We are here to help!  You have tested positive for COVID-19, meaning that you were infected with the novel coronavirus and could give the virus to others.  It is vitally important that you stay home so you do not spread it to others.      Please continue isolation at home, for at least 10 days since the start of your symptoms and until you have had 24 hours with no fever (without taking a fever reducer) and with improving of symptoms.  If you have no symptoms but tested positive (or all symptoms resolve after 5 days and you have no fever) you can leave your house but continue to wear a mask around others for an additional 5 days. If you have a fever,continue to stay home until you have had 24 hours of no fever. Most cases improve 5-10 days from onset but we have seen a small number of patients who have gotten worse after the 10 days.  Please be sure to watch for worsening symptoms and remain taking the proper precautions.   Go to the nearest hospital ED for assessment if fever/cough/breathlessness are severe or illness seems like a threat to life.    The following symptoms may appear 2-14 days after exposure: Fever Cough Shortness of breath or difficulty breathing Chills Repeated shaking with chills Muscle pain Headache Sore throat New loss of taste or smell Fatigue Congestion or runny nose Nausea or vomiting Diarrhea  You have been enrolled in MyChart Home Monitoring for COVID-19. Daily you will receive a questionnaire within the MyChart website. Our COVID-19 response team will be monitoring your responses daily.  You can use medication such as prescription cough medication called Tessalon Perles 100 mg. You may take 1-2 capsules every 8 hours as needed for cough, prescription cough medication called Phenergan DM 6.25 mg/15 mg. You make take one teaspoon / 5 ml every 4-6 hours as needed for cough,  prescription for Fluticasone nasal spray 2 sprays in each nostril one time per day, and prescription for Zofran 4 mg tablets 1 every 6 hours if needed for nausea  You may also take acetaminophen (Tylenol) as needed for fever.  Can take to lessen severity: Vit C 500mg  twice daily Quercertin 250-500mg  twice daily Zinc 75-100mg  daily Melatonin 3-6 mg at bedtime Vit D3 1000-2000 IU daily Aspirin 81 mg daily with food Optional: Famotidine 20mg  daily Also can add tylenol/ibuprofen as needed for fevers and body aches May add Mucinex or Mucinex DM as needed for cough/congestion  According to the information in your chart your risk score is at a 0 which is considered low risk of complications. As such, antivirals are not routinely recommended. If you feel that your chart does not list chronic conditions that you actually have that would raise risk  (asthma, COPD, cancer, heart disease, medications that reduce immune system, etc) and warrant antivirals, then I recommend you submit a virtual urgent care visit through MyChart so these can be discussed further and a discussion of antivirals can be had. We cannot prescribe these via e-visit alone so you would need to do a video visit through MyChart or reach out to your PCP. Otherwise, please follow the care plan given. Take care and feel better!  HOME CARE: Only take medications as instructed by your medical team. Drink plenty of fluids and get plenty of rest. A steam or ultrasonic humidifier can  help if you have congestion.   GET HELP RIGHT AWAY IF YOU HAVE EMERGENCY WARNING SIGNS.  Call 911 or proceed to your closest emergency facility if: You develop worsening high fever. Trouble breathing Bluish lips or face Persistent pain or pressure in the chest New confusion Inability to wake or stay awake You cough up blood. Your symptoms become more severe Inability to hold down food or fluids  This list is not all possible symptoms. Contact your  medical provider for any symptoms that are severe or concerning to you.    Your e-visit answers were reviewed by a board certified advanced clinical practitioner to complete your personal care plan.  Depending on the condition, your plan could have included both over the counter or prescription medications.  If there is a problem please reply once you have received a response from your provider.  Your safety is important to [place].  If you have drug allergies check your prescription carefully.    You can use MyChart to ask questions about today's visit, request a non-urgent call back, or ask for a work or school excuse for 24 hours related to this e-Visit. If it has been greater than 24 hours you will need to follow up with your provider, or enter a new e-Visit to address those concerns. You will get an e-mail in the next two days asking about your experience.  I hope that your e-visit has been valuable and will speed your recovery. Thank you for using e-visits.   I provided 5 minutes of non face-to-face time during this encounter for chart review and documentation.

## 2021-09-16 ENCOUNTER — Ambulatory Visit: Payer: No Typology Code available for payment source | Admitting: Obstetrics & Gynecology

## 2021-10-22 ENCOUNTER — Ambulatory Visit
Admission: EM | Admit: 2021-10-22 | Discharge: 2021-10-22 | Disposition: A | Discharge status: Home / Self Care | Payer: No Typology Code available for payment source | Attending: Emergency Medicine | Admitting: Emergency Medicine

## 2021-10-22 ENCOUNTER — Encounter: Payer: Self-pay | Admitting: Emergency Medicine

## 2021-10-22 DIAGNOSIS — J0140 Acute pansinusitis, unspecified: Secondary | ICD-10-CM | POA: Diagnosis not present

## 2021-10-22 MED ORDER — IPRATROPIUM BROMIDE 0.06 % NA SOLN: 2.0000 | Freq: Four times a day (QID) | NASAL | 12 refills | Status: DC

## 2021-10-22 MED ORDER — AMOXICILLIN-POT CLAVULANATE 875-125 MG PO TABS: 1.0000 | ORAL_TABLET | Freq: Two times a day (BID) | ORAL | 0 refills | Status: AC

## 2021-10-22 MED ORDER — PROMETHAZINE-DM 6.25-15 MG/5ML PO SYRP: 5.0000 mL | ORAL_SOLUTION | Freq: Four times a day (QID) | ORAL | 0 refills | Status: DC | PRN

## 2021-10-22 MED ORDER — BENZONATATE 100 MG PO CAPS: 200.0000 mg | ORAL_CAPSULE | Freq: Three times a day (TID) | ORAL | 0 refills | Status: DC

## 2021-10-22 NOTE — ED Triage Notes (Signed)
Pt was dx with flu 2 weeks ago. She has a cough, nasal congestion, and a sore in her nose.

## 2021-10-22 NOTE — ED Provider Notes (Signed)
[facility]    CSN: [id number] Arrival date & time: 10/22/21  1458      History   Chief Complaint Chief Complaint  Patient presents with   Cough   Nasal Congestion    HPI Alicia [name] is a 30 y.o. female.   HPI  30 year old female here for evaluation of respiratory complaints.  Patient reports that she was diagnosed with influenza 2 weeks ago and since that time she is continue to experience nasal congestion with thick yellow nasal discharge, pressure in her sinuses, crackling in her ear, and a cough that is productive for yellow sputum.  She denies any fever, shortness breath or wheezing, or GI complaints.  She is also concerned because she noticed a sore in her right nare this morning when she was adjusting her nose ring.  She states that it does not hurt.  Past Medical History:  Diagnosis Date   Anemia    Anxiety    Depression    Family history of breast cancer    9/21 cancer genetic testing letter sent   Family history of ovarian cancer     Patient Active Problem List   Diagnosis Date Noted   History of cesarean delivery 03/18/2021   S/P cesarean section 08/30/2015   Family history of genetic disease carrier 03/25/2015    Past Surgical History:  Procedure Laterality Date   CESAREAN SECTION N/A 08/30/2015   Procedure: CESAREAN SECTION;  Surgeon: Gae Dry, MD;  Location: [facility];  Service: Obstetrics;  Laterality: N/A;   CESAREAN SECTION N/A 03/18/2021   Procedure: CESAREAN SECTION;  Surgeon: Gae Dry, MD;  Location: [facility];  Service: Obstetrics;  Laterality: N/A;    OB History     Gravida  2   Para  1   Term  1   Preterm  0   AB  0   Living  1      SAB  0   IAB  0   Ectopic  0   Multiple      Live Births  1            Home Medications    Prior to Admission medications   Medication Sig Start Date End Date Taking? Authorizing Provider  amoxicillin-clavulanate (AUGMENTIN) 875-125 MG tablet Take 1  tablet by mouth every 12 (twelve) hours for 10 days. 10/22/21 11/01/21 Yes Margarette Canada, NP  benzonatate (TESSALON) 100 MG capsule Take 2 capsules (200 mg total) by mouth every 8 (eight) hours. 10/22/21  Yes Margarette Canada, NP  ipratropium (ATROVENT) 0.06 % nasal spray Place 2 sprays into both nostrils 4 (four) times daily. 10/22/21  Yes Margarette Canada, NP  promethazine-dextromethorphan (PROMETHAZINE-DM) 6.25-15 MG/5ML syrup Take 5 mLs by mouth 4 (four) times daily as needed. 10/22/21  Yes Margarette Canada, NP  fluticasone (FLONASE) 50 MCG/ACT nasal spray Place 2 sprays into both nostrils daily. 09/04/21   Mar Daring, PA-C  Norethindrone-Ethinyl Estradiol-Fe Biphas (LO LOESTRIN FE) 1 MG-10 MCG / 10 MCG tablet Take 1 tablet by mouth daily. 04/29/21 07/22/21  Gae Dry, MD  ondansetron (ZOFRAN) 4 MG tablet Take 1 tablet (4 mg total) by mouth every 8 (eight) hours as needed for nausea or vomiting. 09/04/21   Mar Daring, PA-C  sertraline (ZOLOFT) 50 MG tablet Take 1 tablet (50 mg total) by mouth daily. 12/12/20   Imagene Riches, CNM    Family History Family History  Problem Relation Age of Onset  Pancreatic cancer Maternal Grandfather    Ovarian cancer Paternal Grandmother    Breast cancer Paternal Aunt 35   Ovarian cancer Cousin 35    Social History Social History   Tobacco Use   Smoking status: Never   Smokeless tobacco: Never  Vaping Use   Vaping Use: Never used  Substance Use Topics   Alcohol use: No   Drug use: No     Allergies   Patient has no known allergies.   Review of Systems Review of Systems  Constitutional:  Negative for activity change, appetite change and fever.  HENT:  Positive for congestion, ear pain and sinus pressure. Negative for sore throat.   Respiratory:  Positive for cough. Negative for shortness of breath and wheezing.   Gastrointestinal:  Negative for diarrhea, nausea and vomiting.  Skin:  Negative for rash.  Hematological: Negative.    Psychiatric/Behavioral: Negative.      Physical Exam Triage Vital Signs ED Triage Vitals  Enc Vitals Group     BP 10/22/21 1608 105/70     Pulse Rate 10/22/21 1608 75     Resp 10/22/21 1608 18     Temp 10/22/21 1608 98.4 F (36.9 C)     Temp Source 10/22/21 1608 Oral     SpO2 10/22/21 1608 100 %     Weight --      Height --      Head Circumference --      Peak Flow --      Pain Score 10/22/21 1607 0     Pain Loc --      Pain Edu? --      Excl. in [place]? --    No data found.  Updated Vital Signs BP 105/70 (BP Location: Right Arm)   Pulse 75   Temp 98.4 F (36.9 C) (Oral)   Resp 18   LMP 10/01/2021   SpO2 100%   Visual Acuity Right Eye Distance:   Left Eye Distance:   Bilateral Distance:    Right Eye Near:   Left Eye Near:    Bilateral Near:     Physical Exam Vitals and nursing note reviewed.  Constitutional:      General: She is not in acute distress.    Appearance: Normal appearance. She is normal weight. She is not ill-appearing.  HENT:     Head: Normocephalic and atraumatic.     Right Ear: Tympanic membrane, ear canal and external ear normal. There is no impacted cerumen.     Left Ear: Tympanic membrane, ear canal and external ear normal. There is no impacted cerumen.     Nose: Congestion and rhinorrhea present.     Mouth/Throat:     Mouth: Mucous membranes are moist.     Pharynx: Oropharynx is clear. No posterior oropharyngeal erythema.  Cardiovascular:     Rate and Rhythm: Normal rate and regular rhythm.     Pulses: Normal pulses.     Heart sounds: Normal heart sounds. No murmur heard.   No gallop.  Pulmonary:     Effort: Pulmonary effort is normal.     Breath sounds: Normal breath sounds. No wheezing, rhonchi or rales.  Musculoskeletal:     Cervical back: Normal range of motion and neck supple.  Lymphadenopathy:     Cervical: No cervical adenopathy.  Skin:    General: Skin is warm and dry.     Capillary Refill: Capillary refill takes less than  2 seconds.     Findings: No erythema  or rash.  Neurological:     General: No focal deficit present.     Mental Status: She is alert and oriented to person, place, and time.  Psychiatric:        Mood and Affect: Mood normal.        Behavior: Behavior normal.        Thought Content: Thought content normal.        Judgment: Judgment normal.     UC Treatments / Results  Labs (all labs ordered are listed, but only abnormal results are displayed) Labs Reviewed - No data to display  EKG   Radiology No results found.  Procedures Procedures (including critical care time)  Medications Ordered in UC Medications - No data to display  Initial Impression / Assessment and Plan / UC Course  I have reviewed the triage vital signs and the nursing notes.  Pertinent labs & imaging results that were available during my care of the patient were reviewed by me and considered in my medical decision making (see chart for details).  Patient is a nontoxic-appearing 30 year old female here for evaluation of ongoing and continued nasal congestion, nasal discharge, and cough since being diagnosed with influenza 2 weeks ago.  She denies any fever, shortness breath or wheezing, or GI complaints.  On physical exam patient has pearly gray tympanic membranes bilaterally with normal light reflex and clear external auditory canals.  Nasal mucosa is erythematous and edematous with purulent discharge in both nares.  On the floor of the right nare there is a flesh-colored erosion that is free of edema, erythema, bleeding, or tenderness.  There are no other sores noted within the nose.  Patient does have tenderness with percussion of maxillary sinuses bilaterally.  Oropharyngeal exam is benign.  No cervical lymphadenopathy appreciable exam.  Cardiopulmonary exam reveals clear lung sounds in all fields.  We will treat patient for sinusitis with Augmentin twice daily for 10 days and I encouraged her to perform sinus  irrigation to help relieve the mucus burden in her nasal passages.  I have also prescribed Atrovent nasal spray to help with nasal congestion, Tessalon Perles and Promethazine DM cough syrup to help with cough and congestion.   Final Clinical Impressions(s) / UC Diagnoses   Final diagnoses:  Acute non-recurrent pansinusitis     Discharge Instructions      The Augmentin twice daily with food for 10 days for treatment of your sinusitis.  Perform sinus irrigation 2-3 times a day with a [employer] sinus rinse kit and distilled water.  Do not use tap water.  You can use plain over-the-counter Mucinex every 6 hours to break up the stickiness of the mucus so your body can clear it.  Increase your oral fluid intake to thin out your mucus so that is also able for your body to clear more easily.  Take an over-the-counter probiotic, such as Culturelle-align-activia, 1 hour after each dose of antibiotic to prevent diarrhea.  Use the Atrovent nasal spray, 2 squirts in each nostril every 6 hours, as needed for runny nose and postnasal drip.  Use the Tessalon Perles every 8 hours during the day.  Take them with a small sip of water.  They may give you some numbness to the base of your tongue or a metallic taste in your mouth, this is normal.  Use the Promethazine DM cough syrup at bedtime for cough and congestion.  It will make you drowsy so do not take it during the day.  If  you develop any new or worsening symptoms return for reevaluation or see your primary care provider.      ED Prescriptions     Medication Sig Dispense Auth. Provider   amoxicillin-clavulanate (AUGMENTIN) 875-125 MG tablet Take 1 tablet by mouth every 12 (twelve) hours for 10 days. 20 tablet Margarette Canada, NP   benzonatate (TESSALON) 100 MG capsule Take 2 capsules (200 mg total) by mouth every 8 (eight) hours. 21 capsule Margarette Canada, NP   ipratropium (ATROVENT) 0.06 % nasal spray Place 2 sprays into both nostrils 4 (four)  times daily. 15 mL Margarette Canada, NP   promethazine-dextromethorphan (PROMETHAZINE-DM) 6.25-15 MG/5ML syrup Take 5 mLs by mouth 4 (four) times daily as needed. 118 mL Margarette Canada, NP      PDMP not reviewed this encounter.   Margarette Canada, NP 10/22/21 1650

## 2021-10-22 NOTE — Discharge Instructions (Signed)
The Augmentin twice daily with food for 10 days for treatment of your sinusitis.  Perform sinus irrigation 2-3 times a day with a [employer]Med sinus rinse kit and distilled water.  Do not use tap water.  You can use plain over-the-counter Mucinex every 6 hours to break up the stickiness of the mucus so your body can clear it.  Increase your oral fluid intake to thin out your mucus so that is also able for your body to clear more easily.  Take an over-the-counter probiotic, such as Culturelle-align-activia, 1 hour after each dose of antibiotic to prevent diarrhea.  Use the Atrovent nasal spray, 2 squirts in each nostril every 6 hours, as needed for runny nose and postnasal drip.  Use the Tessalon Perles every 8 hours during the day.  Take them with a small sip of water.  They may give you some numbness to the base of your tongue or a metallic taste in your mouth, this is normal.  Use the Promethazine DM cough syrup at bedtime for cough and congestion.  It will make you drowsy so do not take it during the day.  If you develop any new or worsening symptoms return for reevaluation or see your primary care provider.  

## 2021-10-23 ENCOUNTER — Ambulatory Visit: Payer: No Typology Code available for payment source

## 2021-11-04 ENCOUNTER — Ambulatory Visit: Payer: No Typology Code available for payment source | Admitting: Obstetrics & Gynecology

## 2021-12-19 ENCOUNTER — Telehealth: Payer: No Typology Code available for payment source | Admitting: Physician Assistant

## 2021-12-19 DIAGNOSIS — B9689 Other specified bacterial agents as the cause of diseases classified elsewhere: Secondary | ICD-10-CM | POA: Diagnosis not present

## 2021-12-19 DIAGNOSIS — J019 Acute sinusitis, unspecified: Secondary | ICD-10-CM | POA: Diagnosis not present

## 2021-12-19 MED ORDER — AMOXICILLIN-POT CLAVULANATE 875-125 MG PO TABS: 1.0000 | ORAL_TABLET | Freq: Two times a day (BID) | ORAL | 0 refills | Status: DC

## 2021-12-19 MED ORDER — PREDNISONE 20 MG PO TABS: 40.0000 mg | ORAL_TABLET | Freq: Every day | ORAL | 0 refills | Status: AC

## 2021-12-19 NOTE — Progress Notes (Signed)
I have spent 5 minutes in review of e-visit questionnaire, review and updating patient chart, medical decision making and response to patient.   Jamaiyah Pyle Cody Mandi Mattioli, PA-C    

## 2021-12-19 NOTE — Progress Notes (Signed)
E-Visit for Sinus Problems  We are sorry that you are not feeling well.  Here is how we plan to help!  Based on what you have shared with me it looks like you have sinusitis.  Sinusitis is inflammation and infection in the sinus cavities of the head.  Based on your presentation I believe you most likely have Acute Bacterial Sinusitis.  This is an infection caused by bacteria and is treated with antibiotics. I have prescribed Augmentin 875mg /125mg  one tablet twice daily with food, for 7 days. You may use an oral decongestant such as Mucinex D or if you have glaucoma or high blood pressure use plain Mucinex. Saline nasal spray help and can safely be used as often as needed for congestion. I have also sent in a 5-day burst of steroid to help reduce sinus inflammation.  If you develop worsening sinus pain, fever or notice severe headache and vision changes, or if symptoms are not better after completion of antibiotic, please schedule an appointment with a health care provider.    Sinus infections are not as easily transmitted as other respiratory infection, however we still recommend that you avoid close contact with loved ones, especially the very young and elderly.  Remember to wash your hands thoroughly throughout the day as this is the number one way to prevent the spread of infection!  Home Care: Only take medications as instructed by your medical team. Complete the entire course of an antibiotic. Do not take these medications with alcohol. A steam or ultrasonic humidifier can help congestion.  You can place a towel over your head and breathe in the steam from hot water coming from a faucet. Avoid close contacts especially the very young and the elderly. Cover your mouth when you cough or sneeze. Always remember to wash your hands.  Get Help Right Away If: You develop worsening fever or sinus pain. You develop a severe head ache or visual changes. Your symptoms persist after you have completed  your treatment plan.  Make sure you Understand these instructions. Will watch your condition. Will get help right away if you are not doing well or get worse.  Thank you for choosing an e-visit.  Your e-visit answers were reviewed by a board certified advanced clinical practitioner to complete your personal care plan. Depending upon the condition, your plan could have included both over the counter or prescription medications.  Please review your pharmacy choice. Make sure the pharmacy is open so you can pick up prescription now. If there is a problem, you may contact your provider through and have the prescription routed to another pharmacy.  Your safety is important to [employer]. If you have drug allergies check your prescription carefully.   For the next 24 hours you can use MyChart to ask questions about today's visit, request a non-urgent call back, or ask for a work or school excuse. You will get an email in the next two days asking about your experience. I hope that your e-visit has been valuable and will speed your recovery.

## 2021-12-23 MED ORDER — FLUCONAZOLE 150 MG PO TABS: 150.0000 mg | ORAL_TABLET | Freq: Once | ORAL | 0 refills | Status: AC

## 2021-12-23 NOTE — Addendum Note (Signed)
Addended by: Waldon Merl on: 12/23/2021 11:51 AM   Modules accepted: Orders

## 2021-12-25 ENCOUNTER — Ambulatory Visit (HOSPITAL_COMMUNITY)
Admission: EM | Admit: 2021-12-25 | Discharge: 2021-12-25 | Disposition: A | Discharge status: Home / Self Care | Payer: No Typology Code available for payment source | Attending: Urgent Care | Admitting: Urgent Care

## 2021-12-25 ENCOUNTER — Other Ambulatory Visit: Payer: Self-pay

## 2021-12-25 ENCOUNTER — Encounter (HOSPITAL_COMMUNITY): Payer: Self-pay | Admitting: Emergency Medicine

## 2021-12-25 DIAGNOSIS — H66012 Acute suppurative otitis media with spontaneous rupture of ear drum, left ear: Secondary | ICD-10-CM

## 2021-12-25 DIAGNOSIS — U071 COVID-19: Secondary | ICD-10-CM

## 2021-12-25 MED ORDER — FLUTICASONE PROPIONATE 50 MCG/ACT NA SUSP
1.0000 | Freq: Two times a day (BID) | NASAL | 0 refills | Status: DC
Start: 2021-12-25 — End: 2024-04-13

## 2021-12-25 MED ORDER — CEFDINIR 300 MG PO CAPS: 300.0000 mg | ORAL_CAPSULE | Freq: Two times a day (BID) | ORAL | 0 refills | Status: AC

## 2021-12-25 MED ORDER — CIPROFLOXACIN-DEXAMETHASONE 0.3-0.1 % OT SUSP: 4.0000 [drp] | Freq: Two times a day (BID) | OTIC | 0 refills | Status: DC

## 2021-12-25 NOTE — Discharge Instructions (Signed)
You have a left ear infection. Please stop the Augmentin, we have changed it to cefdinir.  This is twice a day for 10 days.  Please complete all of the antibiotics. Also use Flonase to help open up your eustachian tube.  Stop using the nasal spray if any nasal bleeding or irritation occurs. Use the eardrops 4 drops, twice a day to the left ear only Call your PCP and schedule a follow-up in 10 to 14 days to ensure all of the fluid is resolved

## 2021-12-25 NOTE — ED Provider Notes (Signed)
[facility]    CSN: [id number] Arrival date & time: 12/25/21  0809      History   Chief Complaint Chief Complaint  Patient presents with   Otalgia    HPI Alicia [name] is a 31 y.o. female.   Pleasant 31 year old female presents today with left ear pain.  She was seen by her primary care physician via telehealth appointment 6 days ago.  She was given a prescription for Augmentin twice daily x7 days and prednisone, 40 mg daily x5 days.  She has completed the prednisone and still has 2 days left of the Augmentin.  She states that it has not changed or helped at all, and her symptoms have worsened.  She denies any symptoms on the right.  She denies any headache.  She denies fever, nasal drainage.  She states that she can hear fluid sloshing around in her ear, and reports that over the past few days she has felt liquid dripping in her ear canal.  She states she cannot hear in that ear.  She denies any additional concerns or complaints.   Otalgia  Past Medical History:  Diagnosis Date   Anemia    Anxiety    Depression    Family history of breast cancer    9/21 cancer genetic testing letter sent   Family history of ovarian cancer     Patient Active Problem List   Diagnosis Date Noted   History of cesarean delivery 03/18/2021   S/P cesarean section 08/30/2015   Family history of genetic disease carrier 03/25/2015    Past Surgical History:  Procedure Laterality Date   CESAREAN SECTION N/A 08/30/2015   Procedure: CESAREAN SECTION;  Surgeon: Nadara Mustard, MD;  Location: [facility];  Service: Obstetrics;  Laterality: N/A;   CESAREAN SECTION N/A 03/18/2021   Procedure: CESAREAN SECTION;  Surgeon: Nadara Mustard, MD;  Location: [facility];  Service: Obstetrics;  Laterality: N/A;    OB History     Gravida  2   Para  1   Term  1   Preterm  0   AB  0   Living  1      SAB  0   IAB  0   Ectopic  0   Multiple      Live Births  1             Home Medications    Prior to Admission medications   Medication Sig Start Date End Date Taking? Authorizing Provider  cefdinir (OMNICEF) 300 MG capsule Take 1 capsule (300 mg total) by mouth 2 (two) times daily for 10 days. 12/25/21 01/04/22 Yes Nahiara Kretzschmar L, PA  ciprofloxacin-dexamethasone (CIPRODEX) OTIC suspension Place 4 drops into the left ear 2 (two) times daily. 12/25/21  Yes Shenae Bonanno L, PA  fluticasone (FLONASE) 50 MCG/ACT nasal spray Place 1 spray into both nostrils in the morning and at bedtime. 12/25/21   Julien Berryman L, PA  Norethindrone-Ethinyl Estradiol-Fe Biphas (LO LOESTRIN FE) 1 MG-10 MCG / 10 MCG tablet Take 1 tablet by mouth daily. 04/29/21 07/22/21  Nadara Mustard, MD  sertraline (ZOLOFT) 50 MG tablet Take 1 tablet (50 mg total) by mouth daily. 12/12/20   Mirna Mires, CNM    Family History Family History  Problem Relation Age of Onset   Pancreatic cancer Maternal Grandfather    Ovarian cancer Paternal Grandmother    Breast cancer Paternal Aunt 61   Ovarian cancer Cousin 30  Social History Social History   Tobacco Use   Smoking status: Never   Smokeless tobacco: Never  Vaping Use   Vaping Use: Never used  Substance Use Topics   Alcohol use: No   Drug use: No     Allergies   Patient has no known allergies.   Review of Systems Review of Systems  HENT:  Positive for ear pain.   All other systems reviewed and are negative.   Physical Exam Triage Vital Signs ED Triage Vitals  Enc Vitals Group     BP 12/25/21 0829 117/76     Pulse Rate 12/25/21 0829 84     Resp 12/25/21 0829 17     Temp 12/25/21 0829 98.2 F (36.8 C)     Temp Source 12/25/21 0829 Oral     SpO2 12/25/21 0829 98 %     Weight --      Height --      Head Circumference --      Peak Flow --      Pain Score 12/25/21 0828 7     Pain Loc --      Pain Edu? --      Excl. in GC? --    No data found.  Updated Vital Signs BP 117/76 (BP Location: Right Arm)    Pulse  84    Temp 98.2 F (36.8 C) (Oral)    Resp 17    LMP 12/23/2021    SpO2 98%   Visual Acuity Right Eye Distance:   Left Eye Distance:   Bilateral Distance:    Right Eye Near:   Left Eye Near:    Bilateral Near:     Physical Exam Vitals and nursing note reviewed.  Constitutional:      General: She is not in acute distress.    Appearance: Normal appearance. She is well-developed and normal weight. She is not ill-appearing, toxic-appearing or diaphoretic.  HENT:     Head: Normocephalic and atraumatic.     Right Ear: Tympanic membrane, ear canal and external ear normal. There is no impacted cerumen.     Left Ear: There is no impacted cerumen.     Ears:     Comments: L TM erythematous and bulging superiorly Inferiorly is a thick greenish-yellow effusion. Suspect pinpoint perforation to inferior TM as mild amount of fluid noted to ear canal. No edema to L ear canal Mild pre-auricular lymphadenopathy NO tenderness to sinuses or mastoid    Nose: Nose normal. No congestion or rhinorrhea.     Mouth/Throat:     Mouth: Mucous membranes are moist.     Pharynx: Oropharynx is clear. No oropharyngeal exudate or posterior oropharyngeal erythema.  Eyes:     General: No scleral icterus.       Right eye: No discharge.        Left eye: No discharge.     Extraocular Movements: Extraocular movements intact.     Conjunctiva/sclera: Conjunctivae normal.     Pupils: Pupils are equal, round, and reactive to light.  Cardiovascular:     Rate and Rhythm: Normal rate and regular rhythm.     Heart sounds: No murmur heard. Pulmonary:     Effort: Pulmonary effort is normal. No respiratory distress.     Breath sounds: Normal breath sounds. No stridor. No wheezing, rhonchi or rales.  Chest:     Chest wall: No tenderness.  Abdominal:     Palpations: Abdomen is soft.  Musculoskeletal:  General: No swelling.     Cervical back: Normal range of motion and neck supple. No rigidity or tenderness.   Lymphadenopathy:     Cervical: No cervical adenopathy.  Skin:    General: Skin is warm and dry.     Capillary Refill: Capillary refill takes less than 2 seconds.     Coloration: Skin is not jaundiced.     Findings: No bruising, erythema or rash.  Neurological:     General: No focal deficit present.     Mental Status: She is alert.     Cranial Nerves: No cranial nerve deficit.  Psychiatric:        Mood and Affect: Mood normal.     UC Treatments / Results  Labs (all labs ordered are listed, but only abnormal results are displayed) Labs Reviewed - No data to display  EKG   Radiology No results found.  Procedures Procedures (including critical care time)  Medications Ordered in UC Medications - No data to display  Initial Impression / Assessment and Plan / UC Course  I have reviewed the triage vital signs and the nursing notes.  Pertinent labs & imaging results that were available during my care of the patient were reviewed by me and considered in my medical decision making (see chart for details).     OM with effusion -stop Augmentin.  Changed to cefdinir twice daily x10 days to also cover possible developing sinusitis.  May use Flonase to help open up eustachian tube.  Given external auditory canal symptoms, will also add topical drops.  With PCP in 10 to 14 days for recheck.  ER precautions reviewed.  Final Clinical Impressions(s) / UC Diagnoses   Final diagnoses:  Non-recurrent acute suppurative otitis media of left ear with spontaneous rupture of tympanic membrane     Discharge Instructions      You have a left ear infection. Please stop the Augmentin, we have changed it to cefdinir.  This is twice a day for 10 days.  Please complete all of the antibiotics. Also use Flonase to help open up your eustachian tube.  Stop using the nasal spray if any nasal bleeding or irritation occurs. Use the eardrops 4 drops, twice a day to the left ear only Call your PCP and  schedule a follow-up in 10 to 14 days to ensure all of the fluid is resolved    ED Prescriptions     Medication Sig Dispense Auth. Provider   fluticasone (FLONASE) 50 MCG/ACT nasal spray Place 1 spray into both nostrils in the morning and at bedtime. 16 g Gladie Gravette L, PA   cefdinir (OMNICEF) 300 MG capsule Take 1 capsule (300 mg total) by mouth 2 (two) times daily for 10 days. 20 capsule Shilo Philipson L, PA   ciprofloxacin-dexamethasone (CIPRODEX) OTIC suspension Place 4 drops into the left ear 2 (two) times daily. 7.5 mL Ahman Dugdale L, PA      PDMP not reviewed this encounter.   Maretta Bees, [place] 12/25/21 [phone]

## 2021-12-25 NOTE — ED Triage Notes (Signed)
Pt reports that she is on antibiotics for ear infection already. Reports that Sunday her left ear drained. Reports having lots of pain and pressure in left ear and muffled hearing.

## 2022-08-23 ENCOUNTER — Telehealth: Payer: No Typology Code available for payment source | Admitting: Physician Assistant

## 2022-08-23 DIAGNOSIS — B3731 Acute candidiasis of vulva and vagina: Secondary | ICD-10-CM | POA: Diagnosis not present

## 2022-08-24 MED ORDER — FLUCONAZOLE 150 MG PO TABS: 150.0000 mg | ORAL_TABLET | Freq: Once | ORAL | 0 refills | Status: AC

## 2022-08-24 NOTE — Progress Notes (Signed)

## 2022-08-24 NOTE — Progress Notes (Signed)
I have spent 5 minutes in review of e-visit questionnaire, review and updating patient chart, medical decision making and response to patient.   Carmaleta Youngers Cody Everline Mahaffy, PA-C    

## 2022-09-17 ENCOUNTER — Telehealth: Payer: No Typology Code available for payment source | Admitting: Physician Assistant

## 2022-09-17 DIAGNOSIS — R112 Nausea with vomiting, unspecified: Secondary | ICD-10-CM

## 2022-09-17 MED ORDER — ONDANSETRON 4 MG PO TBDP: 4.0000 mg | ORAL_TABLET | Freq: Three times a day (TID) | ORAL | 0 refills | Status: DC | PRN

## 2022-09-17 NOTE — Progress Notes (Signed)
E-Visit for Vomiting  We are sorry that you are not feeling well. Here is how we plan to help!    Vomiting is the forceful emptying of a portion of the stomach's content through the mouth.  Although nausea and vomiting can make you feel miserable, it's important to remember that these are not diseases, but rather symptoms of an underlying illness.  When we treat short term symptoms, we always caution that any symptoms that persist should be fully evaluated in a medical office.  I have prescribed a medication that will help alleviate your symptoms and allow you to stay hydrated:  Zofran 4 mg 1 tablet every 8 hours as needed for nausea and vomiting  For headache, you can take OTC Excedrin migraine and drink some caffeine to try to abort the headache. If these seem to upset stomach worse, despite the zofran, I want you to be seen in person.   HOME CARE: Drink clear liquids.  This is very important! Dehydration (the lack of fluid) can lead to a serious complication.  Start off with 1 tablespoon every 5 minutes for 8 hours. You may begin eating bland foods after 8 hours without vomiting.  Start with saltine crackers, white bread, rice, mashed potatoes, applesauce. After 48 hours on a bland diet, you may resume a normal diet. Try to go to sleep.  Sleep often empties the stomach and relieves the need to vomit.  GET HELP RIGHT AWAY IF:  Your symptoms do not improve or worsen within 2 days after treatment. You have a fever for over 3 days. You cannot keep down fluids after trying the medication.  MAKE SURE YOU:  Understand these instructions. Will watch your condition. Will get help right away if you are not doing well or get worse.   Thank you for choosing an e-visit.  Your e-visit answers were reviewed by a board certified advanced clinical practitioner to complete your personal care plan. Depending upon the condition, your plan could have included both over the counter or prescription  medications.  Please review your pharmacy choice. Make sure the pharmacy is open so you can pick up prescription now. If there is a problem, you may contact your provider through [employer] and have the prescription routed to another pharmacy.  Your safety is important to [place]. If you have drug allergies check your prescription carefully.   For the next 24 hours you can use MyChart to ask questions about today's visit, request a non-urgent call back, or ask for a work or school excuse. You will get an email in the next two days asking about your experience. I hope that your e-visit has been valuable and will speed your recovery.

## 2022-09-17 NOTE — Progress Notes (Signed)
I have spent 5 minutes in review of e-visit questionnaire, review and updating patient chart, medical decision making and response to patient.   Stefhanie Kachmar Cody Maycie Luera, PA-C    

## 2022-09-25 ENCOUNTER — Other Ambulatory Visit (HOSPITAL_COMMUNITY): Payer: Self-pay

## 2022-09-25 MED ORDER — DESOGESTREL-ETHINYL ESTRADIOL 0.15-30 MG-MCG PO TABS
1.0000 | ORAL_TABLET | Freq: Every day | ORAL | 0 refills | Status: DC
  Filled 2022-09-25: qty 84, 84d supply, fill #0

## 2022-10-27 ENCOUNTER — Telehealth: Payer: No Typology Code available for payment source | Admitting: Family Medicine

## 2022-10-27 DIAGNOSIS — B379 Candidiasis, unspecified: Secondary | ICD-10-CM

## 2022-10-27 DIAGNOSIS — T3695XA Adverse effect of unspecified systemic antibiotic, initial encounter: Secondary | ICD-10-CM

## 2022-10-27 DIAGNOSIS — R3989 Other symptoms and signs involving the genitourinary system: Secondary | ICD-10-CM

## 2022-10-27 MED ORDER — NITROFURANTOIN MONOHYD MACRO 100 MG PO CAPS: 100.0000 mg | ORAL_CAPSULE | Freq: Two times a day (BID) | ORAL | 0 refills | Status: AC

## 2022-10-27 MED ORDER — FLUCONAZOLE 200 MG PO TABS: 200.0000 mg | ORAL_TABLET | Freq: Once | ORAL | 0 refills | Status: AC

## 2022-10-27 NOTE — Addendum Note (Signed)
Addended by: Margaretann Loveless on: 10/27/2022 09:05 AM   Modules accepted: Orders

## 2022-10-27 NOTE — Progress Notes (Signed)

## 2022-11-17 ENCOUNTER — Other Ambulatory Visit (HOSPITAL_COMMUNITY): Payer: Self-pay

## 2022-11-17 MED ORDER — DESOGESTREL-ETHINYL ESTRADIOL 0.15-30 MG-MCG PO TABS
1.0000 | ORAL_TABLET | Freq: Every day | ORAL | 0 refills | Status: DC
  Filled 2022-11-17 – 2022-11-27 (×2): qty 84, 84d supply, fill #0

## 2022-11-18 ENCOUNTER — Other Ambulatory Visit (HOSPITAL_COMMUNITY): Payer: Self-pay

## 2022-11-27 ENCOUNTER — Other Ambulatory Visit (HOSPITAL_COMMUNITY): Payer: Self-pay

## 2022-11-27 ENCOUNTER — Other Ambulatory Visit: Payer: Self-pay

## 2022-11-30 ENCOUNTER — Telehealth: Payer: 59 | Admitting: Physician Assistant

## 2022-11-30 DIAGNOSIS — M5441 Lumbago with sciatica, right side: Secondary | ICD-10-CM | POA: Diagnosis not present

## 2022-11-30 MED ORDER — NAPROXEN 500 MG PO TABS: 500.0000 mg | ORAL_TABLET | Freq: Two times a day (BID) | ORAL | 0 refills | Status: DC

## 2022-11-30 MED ORDER — CYCLOBENZAPRINE HCL 10 MG PO TABS: 10.0000 mg | ORAL_TABLET | Freq: Three times a day (TID) | ORAL | 0 refills | Status: DC | PRN

## 2022-11-30 NOTE — Progress Notes (Signed)
I have spent 5 minutes in review of e-visit questionnaire, review and updating patient chart, medical decision making and response to patient.   Shontavia Mickel Cody Jamilyn Pigeon, PA-C    

## 2022-11-30 NOTE — Progress Notes (Signed)

## 2023-01-21 ENCOUNTER — Other Ambulatory Visit (HOSPITAL_COMMUNITY): Payer: Self-pay

## 2023-01-21 MED ORDER — DESOGESTREL-ETHINYL ESTRADIOL 0.15-30 MG-MCG PO TABS
ORAL_TABLET | ORAL | 0 refills | Status: DC
  Filled 2023-01-21: qty 84, 84d supply, fill #0
  Filled 2023-01-29: qty 84, 63d supply, fill #0

## 2023-01-28 ENCOUNTER — Other Ambulatory Visit (HOSPITAL_COMMUNITY): Payer: Self-pay

## 2023-01-29 ENCOUNTER — Other Ambulatory Visit (HOSPITAL_COMMUNITY): Payer: Self-pay

## 2023-03-17 ENCOUNTER — Other Ambulatory Visit (HOSPITAL_COMMUNITY): Payer: Self-pay

## 2023-03-20 ENCOUNTER — Other Ambulatory Visit (HOSPITAL_COMMUNITY): Payer: Self-pay

## 2023-03-20 MED ORDER — DESOGESTREL-ETHINYL ESTRADIOL 0.15-30 MG-MCG PO TABS
ORAL_TABLET | ORAL | 0 refills | Status: DC
  Filled 2023-03-20: qty 84, 84d supply, fill #0

## 2023-03-22 ENCOUNTER — Other Ambulatory Visit (HOSPITAL_COMMUNITY): Payer: Self-pay

## 2023-03-22 ENCOUNTER — Telehealth: Payer: 59 | Admitting: Physician Assistant

## 2023-03-22 DIAGNOSIS — B379 Candidiasis, unspecified: Secondary | ICD-10-CM | POA: Diagnosis not present

## 2023-03-22 DIAGNOSIS — T3695XA Adverse effect of unspecified systemic antibiotic, initial encounter: Secondary | ICD-10-CM

## 2023-03-22 DIAGNOSIS — R3989 Other symptoms and signs involving the genitourinary system: Secondary | ICD-10-CM

## 2023-03-22 MED ORDER — FLUCONAZOLE 150 MG PO TABS
150.0000 mg | ORAL_TABLET | ORAL | 0 refills | Status: DC | PRN
  Filled 2023-03-22: qty 2, 6d supply, fill #0

## 2023-03-22 MED ORDER — NITROFURANTOIN MONOHYD MACRO 100 MG PO CAPS
100.0000 mg | ORAL_CAPSULE | Freq: Two times a day (BID) | ORAL | 0 refills | Status: DC
  Filled 2023-03-22: qty 10, 5d supply, fill #0

## 2023-03-22 NOTE — Progress Notes (Signed)
E-Visit for Urinary Problems  We are sorry that you are not feeling well.  Here is how we plan to help!  Based on what you shared with me it looks like you most likely have a simple urinary tract infection.  A UTI (Urinary Tract Infection) is a bacterial infection of the bladder.  Most cases of urinary tract infections are simple to treat but a key part of your care is to encourage you to drink plenty of fluids and watch your symptoms carefully.  I have prescribed MacroBid 100 mg twice a day for 5 days.  I have also sent Fluconazole 150mg  for yeast infection from antibiotics. Your symptoms should gradually improve. Call us if the burning in your urine worsens, you develop worsening fever, back pain or pelvic pain or if your symptoms do not resolve after completing the antibiotic.  Urinary tract infections can be prevented by drinking plenty of water to keep your body hydrated.  Also be sure when you wipe, wipe from front to back and don't hold it in!  If possible, empty your bladder every 4 hours.  HOME CARE Drink plenty of fluids Compete the full course of the antibiotics even if the symptoms resolve Remember, when you need to go.go. Holding in your urine can increase the likelihood of getting a UTI! GET HELP RIGHT AWAY IF: You cannot urinate You get a high fever Worsening back pain occurs You see blood in your urine You feel sick to your stomach or throw up You feel like you are going to pass out  MAKE SURE YOU  Understand these instructions. Will watch your condition. Will get help right away if you are not doing well or get worse.   Thank you for choosing an e-visit.  Your e-visit answers were reviewed by a board certified advanced clinical practitioner to complete your personal care plan. Depending upon the condition, your plan could have included both over the counter or prescription medications.  Please review your pharmacy choice. Make sure the pharmacy is open so you can  pick up prescription now. If there is a problem, you may contact your provider through [employer] and have the prescription routed to another pharmacy.  Your safety is important to [place]. If you have drug allergies check your prescription carefully.   For the next 24 hours you can use MyChart to ask questions about today's visit, request a non-urgent call back, or ask for a work or school excuse. You will get an email in the next two days asking about your experience. I hope that your e-visit has been valuable and will speed your recovery.  I have spent 5 minutes in review of e-visit questionnaire, review and updating patient chart, medical decision making and response to patient.   Margaretann Loveless, PA-C

## 2023-04-23 ENCOUNTER — Telehealth: Payer: 59 | Admitting: Nurse Practitioner

## 2023-04-23 DIAGNOSIS — R300 Dysuria: Secondary | ICD-10-CM

## 2023-04-23 NOTE — Progress Notes (Signed)
Alicia [name],  Our protocol is to refer you for in person evaluation if you have recently had a UTI. Reviewing your chart you were treated virtually in the past month.   Our reason for in person is so that you can have urine testing and culture completed to assure you are receiving the best antibiotic as some are resistant to certain bacteria and without a culture we cannot be sure what is best for you. Recurrent symptoms this close to a recent infection warrant in person evaluation.   You can call your primary care or OBGYN but if they are unable to see you today here is a list of local Urgent Care facilities Thank you     NOTE: There will be NO CHARGE for this eVisit   If you are having a true medical emergency please call 911.      For an urgent face to face visit, [place] has eight urgent care centers for your convenience:   NEW!! [facility] at [facility] Get Driving Directions [phone] [address] [place], [address]    [facility] Health [facility]gent Care Center at [facility] Get Driving Directions [phone] [address] [place], [place] [address]   [facility] Health Urgent Care Center [facility]) Get Driving Directions [phone] [address] [place], [place] [address]  [facility] Urgent Care Center [facility]) Get Driving Directions [phone] [address] [place],  [place]  [address]  [facility] Health Urgent Care Center [facility] - at [employer]  [phone] [place],  [place] [address]   [facility] Health Urgent Care at [facility] Get Driving Directions [phone] [address] [place], [place] [address]   [facility] Health Urgent Care at [facility] Get Driving Directions  [phone] [address] [place], [place] [address]   [facility] Health Urgent Care at [facility]  Directions [phone] [address] [place], [place] [address]  Your MyChart E-visit questionnaire answers were reviewed by a board certified advanced clinical practitioner to complete your personal care plan based on your specific symptoms.  Thank you for using e-Visits.

## 2023-06-03 ENCOUNTER — Other Ambulatory Visit (HOSPITAL_COMMUNITY): Payer: Self-pay

## 2023-06-03 MED ORDER — DESOGESTREL-ETHINYL ESTRADIOL 0.15-30 MG-MCG PO TABS
ORAL_TABLET | ORAL | 0 refills | Status: DC
  Filled 2023-06-03: qty 84, 84d supply, fill #0

## 2023-06-07 ENCOUNTER — Other Ambulatory Visit (HOSPITAL_COMMUNITY): Payer: Self-pay

## 2023-07-16 ENCOUNTER — Other Ambulatory Visit (HOSPITAL_COMMUNITY): Payer: Self-pay

## 2023-07-16 MED ORDER — CYRED EQ 0.15-30 MG-MCG PO TABS
1.0000 | ORAL_TABLET | Freq: Every day | ORAL | 0 refills | Status: DC
  Filled 2023-07-16 – 2023-08-10 (×4): qty 84, 84d supply, fill #0

## 2023-07-21 ENCOUNTER — Other Ambulatory Visit (HOSPITAL_COMMUNITY): Payer: Self-pay

## 2023-07-22 ENCOUNTER — Other Ambulatory Visit (HOSPITAL_COMMUNITY): Payer: Self-pay

## 2023-08-09 ENCOUNTER — Telehealth: Payer: 59 | Admitting: Family Medicine

## 2023-08-09 ENCOUNTER — Other Ambulatory Visit (HOSPITAL_COMMUNITY): Payer: Self-pay

## 2023-08-09 DIAGNOSIS — J069 Acute upper respiratory infection, unspecified: Secondary | ICD-10-CM

## 2023-08-09 MED ORDER — PSEUDOEPH-BROMPHEN-DM 30-2-10 MG/5ML PO SYRP: 5.0000 mL | ORAL_SOLUTION | Freq: Four times a day (QID) | ORAL | 0 refills | Status: DC | PRN

## 2023-08-09 MED ORDER — AMOXICILLIN-POT CLAVULANATE 875-125 MG PO TABS
1.0000 | ORAL_TABLET | Freq: Two times a day (BID) | ORAL | 0 refills | Status: DC
Start: 2023-08-11 — End: 2023-08-11

## 2023-08-09 NOTE — Progress Notes (Signed)
E-Visit for Sinus Problems  We are sorry that you are not feeling well.  Here is how we plan to help!  Based on what you have shared with me it looks like you have sinusitis.  Sinusitis is inflammation and infection in the sinus cavities of the head.  Based on your presentation I believe you most likely have Acute Bacterial Sinusitis.  This is an infection caused by bacteria and is treated with antibiotics. I have prescribed a delayed antibiotic for pick up in two days if the cough syrup does not improve your symptoms- it helps with congestion and allergies. Bromfed- pick up today and take as directed. You can pick up this medication in 2 days on Wednesday if not improved Augmentin 875mg /125mg  one tablet twice daily with food, for 7 days. You may use an oral decongestant such as Mucinex D or if you have glaucoma or high blood pressure use plain Mucinex. Saline nasal spray help and can safely be used as often as needed for congestion.  If you develop worsening sinus pain, fever or notice severe headache and vision changes, or if symptoms are not better after completion of antibiotic, please schedule an appointment with a health care provider.    Sinus infections are not as easily transmitted as other respiratory infection, however we still recommend that you avoid close contact with loved ones, especially the very young and elderly.  Remember to wash your hands thoroughly throughout the day as this is the number one way to prevent the spread of infection!  Home Care: Only take medications as instructed by your medical team. Complete the entire course of an antibiotic. Do not take these medications with alcohol. A steam or ultrasonic humidifier can help congestion.  You can place a towel over your head and breathe in the steam from hot water coming from a faucet. Avoid close contacts especially the very young and the elderly. Cover your mouth when you cough or sneeze. Always remember to wash your  hands.  Get Help Right Away If: You develop worsening fever or sinus pain. You develop a severe head ache or visual changes. Your symptoms persist after you have completed your treatment plan.  Make sure you Understand these instructions. Will watch your condition. Will get help right away if you are not doing well or get worse.  Thank you for choosing an e-visit.  Your e-visit answers were reviewed by a board certified advanced clinical practitioner to complete your personal care plan. Depending upon the condition, your plan could have included both over the counter or prescription medications.  Please review your pharmacy choice. Make sure the pharmacy is open so you can pick up prescription now. If there is a problem, you may contact your provider through [employer] and have the prescription routed to another pharmacy.  Your safety is important to [place]. If you have drug allergies check your prescription carefully.   For the next 24 hours you can use MyChart to ask questions about today's visit, request a non-urgent call back, or ask for a work or school excuse. You will get an email in the next two days asking about your experience. I hope that your e-visit has been valuable and will speed your recovery.  I provided 5 minutes of non face-to-face time during this encounter for chart review, medication and order placement, as well as and documentation.

## 2023-08-10 ENCOUNTER — Other Ambulatory Visit (HOSPITAL_COMMUNITY): Payer: Self-pay

## 2023-08-11 ENCOUNTER — Ambulatory Visit (INDEPENDENT_AMBULATORY_CARE_PROVIDER_SITE_OTHER): Payer: 59 | Admitting: Physician Assistant

## 2023-08-11 ENCOUNTER — Encounter: Payer: Self-pay | Admitting: Physician Assistant

## 2023-08-11 VITALS — BP 110/80 | HR 70 | Temp 98.4°F | Ht 62.0 in | Wt 149.0 lb

## 2023-08-11 DIAGNOSIS — G43009 Migraine without aura, not intractable, without status migrainosus: Secondary | ICD-10-CM | POA: Diagnosis not present

## 2023-08-11 DIAGNOSIS — Z862 Personal history of diseases of the blood and blood-forming organs and certain disorders involving the immune mechanism: Secondary | ICD-10-CM | POA: Diagnosis not present

## 2023-08-11 MED ORDER — NURTEC 75 MG PO TBDP: 75.0000 mg | ORAL_TABLET | Freq: Once | ORAL | Status: AC | PRN

## 2023-08-11 NOTE — Progress Notes (Signed)
Date:  08/11/2023   Name:  Alicia [name]   DOB:  08-01-1991   MRN:  [id number]   Chief Complaint: Establish Care and Migraine  Migraine  This is a chronic problem. Episode onset: X6 months. The problem occurs daily. The problem has been gradually worsening. Pain location: all over. The pain radiates to the face. The pain quality is similar to prior headaches. The quality of the pain is described as pulsating. The pain is at a severity of 7/10. The pain is moderate. Pertinent negatives include no abdominal pain or fever. The symptoms are aggravated by bright light and noise. She has tried Excedrin, acetaminophen and NSAIDs for the symptoms. The treatment provided moderate relief.   Alicia [name] "Alicia [name]" Alicia [name] is a pleasant 32 year old female with no significant past medical history who presents new to the clinic today to establish care and discuss migraines as described above.  They have been worsening over the last 6 months, typically once per week, one-sided (though alternating between episodes), and associated with nausea, sometimes vomiting, light and sound sensitivity.  She has never taken prescription medication for this problem.  Wonders if her migraines might be stress related.  Historically she does not have routine care aside from perinatal.  Last recorded Hgb was 8.0 at the end of her last pregnancy.  She says that she has checked her own hemoglobin in the lab and it was ~11, has been months since then.   Medication list has been reviewed and updated.  Current Meds  Medication Sig   Rimegepant Sulfate (NURTEC) 75 MG TBDP Take 1 tablet (75 mg total) by mouth once as needed for up to 1 dose (for migraine).   [DISCONTINUED] amoxicillin-clavulanate (AUGMENTIN) 875-125 MG tablet Take 1 tablet by mouth 2 (two) times daily for 7 days.     Review of Systems  Constitutional:  Negative for fatigue and fever.  Respiratory:  Negative for chest tightness and shortness of breath.    Cardiovascular:  Negative for chest pain and palpitations.  Gastrointestinal:  Negative for abdominal pain.  Neurological:  Positive for headaches.    Patient Active Problem List   Diagnosis Date Noted   Migraine without aura and without status migrainosus, not intractable 08/11/2023   History of anemia 08/11/2023   History of cesarean delivery 03/18/2021   S/P cesarean section 08/30/2015   Family history of genetic disease carrier 03/25/2015    No Known Allergies  Immunization History  Administered Date(s) Administered   PFIZER(Purple Top)SARS-COV-2 Vaccination 06/28/2020, 07/19/2020   Tdap 01/13/2021    Past Surgical History:  Procedure Laterality Date   CESAREAN SECTION N/A 08/30/2015   Procedure: CESAREAN SECTION;  Surgeon: Nadara Mustard, MD;  Location: [facility];  Service: Obstetrics;  Laterality: N/A;   CESAREAN SECTION N/A 03/18/2021   Procedure: CESAREAN SECTION;  Surgeon: Nadara Mustard, MD;  Location: [facility] OR[facility];  Service: Obstetrics;  Laterality: N/A;    Social History   Tobacco Use   Smoking status: Never   Smokeless tobacco: Never  Vaping Use   Vaping status: Never Used  Substance Use Topics   Alcohol use: No   Drug use: No    Family History  Problem Relation Age of Onset   Breast cancer Paternal Aunt 10   Stroke Maternal Grandmother    Diabetes Maternal Grandmother    Diabetes Maternal Grandfather    Pancreatic cancer Maternal Grandfather    Stroke Paternal Grandmother    Hypertension Paternal Grandmother    Heart  disease Paternal Grandmother    Diabetes Paternal Grandmother    Ovarian cancer Paternal Grandmother    Diabetes Paternal Grandfather    Ovarian cancer Cousin 30        08/11/2023    3:10 PM  GAD 7 : Generalized Anxiety Score  Nervous, Anxious, on Edge 2  Control/stop worrying 1  Worry too much - different things 2  Trouble relaxing 2  Restless 1  Easily annoyed or irritable 2  Afraid - awful might happen 0  Total GAD 7  Score 10  Anxiety Difficulty Somewhat difficult       08/11/2023    3:10 PM  Depression screen PHQ 2/9  Decreased Interest 1  Down, Depressed, Hopeless 1  PHQ - 2 Score 2  Altered sleeping 0  Tired, decreased energy 2  Change in appetite 0  Feeling bad or failure about yourself  0  Trouble concentrating 0  Moving slowly or fidgety/restless 0  Suicidal thoughts 0  PHQ-9 Score 4  Difficult doing work/chores Not difficult at all    BP Readings from Last 3 Encounters:  08/11/23 110/80  12/25/21 117/76  10/22/21 105/70    Wt Readings from Last 3 Encounters:  08/11/23 149 lb (67.6 kg)  04/29/21 144 lb (65.3 kg)  03/25/21 160 lb (72.6 kg)    BP 110/80   Pulse 70   Temp 98.4 F (36.9 C) (Oral)   Ht 5\' 2"  (1.575 m)   Wt 149 lb (67.6 kg)   BMI 27.25 kg/m   Physical Exam Vitals and nursing note reviewed.  Constitutional:      Appearance: Normal appearance.  Cardiovascular:     Rate and Rhythm: Normal rate and regular rhythm.     Heart sounds: No murmur heard.    No friction rub. No gallop.  Pulmonary:     Effort: Pulmonary effort is normal.     Breath sounds: Normal breath sounds.  Abdominal:     General: There is no distension.  Musculoskeletal:        General: Normal range of motion.  Skin:    General: Skin is warm and dry.  Neurological:     Mental Status: She is alert and oriented to person, place, and time.     Gait: Gait is intact.  Psychiatric:        Mood and Affect: Mood and affect normal.     Recent Labs  No results found for: "NA", "K", "CL", "CO2", "GLUCOSE", "BUN", "CREATININE", "CALCIUM", "PROT", "ALBUMIN", "AST", "ALT", "ALKPHOS", "BILITOT", "GFRNONAA", "GFRAA"  Lab Results  Component Value Date   WBC 8.3 03/19/2021   HGB 8.0 (L) 03/19/2021   HCT 24.4 (L) 03/19/2021   MCV 88.7 03/19/2021   PLT 129 (L) 03/19/2021   No results found for: "HGBA1C" No results found for: "CHOL", "HDL", "LDLCALC", "LDLDIRECT", "TRIG", "CHOLHDL" No results  found for: "TSH"   Assessment and Plan:  1. Migraine without aura and without status migrainosus, not intractable Sounds like classic migraine.  Samples given of Nurtec.  Advised patient to keep a headache journal and try to identify migraine triggers, which we reviewed today.  Check CBC and CMP  - CBC with Differential/Platelet - Comprehensive metabolic panel - Rimegepant Sulfate (NURTEC) 75 MG TBDP; Take 1 tablet (75 mg total) by mouth once as needed for up to 1 dose (for migraine).  Dispense: 4 tablet  2. History of anemia Check CBC and CMP today - CBC with Differential/Platelet - Comprehensive metabolic panel d  Return in about 4 weeks (around 09/08/2023), or OV f/u migraines.    Alvester Morin, PA-C, DMSc, Nutritionist [facility] [phone]

## 2023-08-11 NOTE — Patient Instructions (Signed)
-

## 2023-08-12 LAB — CBC WITH DIFFERENTIAL/PLATELET
Basophils Absolute: 0 10*3/uL (ref 0.0–0.2)
Basos: 1 %
EOS (ABSOLUTE): 0.2 10*3/uL (ref 0.0–0.4)
Eos: 2 %
Hematocrit: 37.2 % (ref 34.0–46.6)
Hemoglobin: 11.8 g/dL (ref 11.1–15.9)
Immature Grans (Abs): 0 10*3/uL (ref 0.0–0.1)
Immature Granulocytes: 0 %
Lymphocytes Absolute: 2.6 10*3/uL (ref 0.7–3.1)
Lymphs: 36 %
MCH: 28.6 pg (ref 26.6–33.0)
MCHC: 31.7 g/dL (ref 31.5–35.7)
MCV: 90 fL (ref 79–97)
Monocytes Absolute: 0.5 10*3/uL (ref 0.1–0.9)
Monocytes: 7 %
Neutrophils Absolute: 3.9 10*3/uL (ref 1.4–7.0)
Neutrophils: 54 %
Platelets: 292 10*3/uL (ref 150–450)
RBC: 4.13 x10E6/uL (ref 3.77–5.28)
RDW: 12.5 % (ref 11.7–15.4)
WBC: 7.2 10*3/uL (ref 3.4–10.8)

## 2023-08-12 LAB — COMPREHENSIVE METABOLIC PANEL
ALT: 15 IU/L (ref 0–32)
AST: 13 IU/L (ref 0–40)
Albumin: 4.5 g/dL (ref 3.9–4.9)
Alkaline Phosphatase: 49 IU/L (ref 44–121)
BUN/Creatinine Ratio: 14 (ref 9–23)
BUN: 11 mg/dL (ref 6–20)
Bilirubin Total: <0.2 mg/dL (ref 0.0–1.2)
CO2: 23 mmol/L (ref 20–29)
Calcium: 9.5 mg/dL (ref 8.7–10.2)
Chloride: 102 mmol/L (ref 96–106)
Creatinine, Ser: 0.79 mg/dL (ref 0.57–1.00)
Globulin, Total: 2.8 g/dL (ref 1.5–4.5)
Glucose: 83 mg/dL (ref 70–99)
Potassium: 4.5 mmol/L (ref 3.5–5.2)
Sodium: 139 mmol/L (ref 134–144)
Total Protein: 7.3 g/dL (ref 6.0–8.5)
eGFR: 102 mL/min/{1.73_m2} (ref >59)

## 2023-09-29 ENCOUNTER — Telehealth: Payer: 59 | Admitting: Physician Assistant

## 2023-09-29 DIAGNOSIS — J029 Acute pharyngitis, unspecified: Secondary | ICD-10-CM

## 2023-09-29 DIAGNOSIS — R1310 Dysphagia, unspecified: Secondary | ICD-10-CM

## 2023-09-29 NOTE — Progress Notes (Signed)
Because of significant difficulty swallowing which raises concern for tonsillar abscess or substantial swelling of tonsils which needs examination, I feel your condition warrants further evaluation and I recommend that you be seen in a face to face visit.   NOTE: There will be NO CHARGE for this eVisit   If you are having a true medical emergency please call 911.      For an urgent face to face visit, [facility] has eight urgent care centers for your convenience:   NEW!! [facility] Health Urgent Care Center at [facility] Get Driving Directions [phone] [address] [place], [address]    [facility] Health Urgent Care Center at [facility] Get Driving Directions [phone] [address] [place], [place] [address]   [facility] Health Urgent Care Center [facility]) Get Driving Directions [phone] [address] [place], [place] [address]  [facility] Health Urgent Care Center [facility]) Get Driving Directions [phone] [address] [place],  [place]  [address]  [facility] Health Urgent Care Center [facility] - at [employer]  [phone] [address] [place],  [place] [address]   [facility] Health Urgent Care at [facility] Get Driving Directions [phone] [address] [place], [place] [address]   [facility] Health Urgent Care at [facility] Get Driving Directions  [phone] [address] [place], [place] [address]   [facility] Health Urgent Care at [facility] Directions [phone] [address] [place], [place] [address]  Your MyChart E-visit questionnaire answers were reviewed by a board certified advanced clinical practitioner to complete your personal care plan based on your specific symptoms.  Thank you for using e-Visits.

## 2023-09-30 ENCOUNTER — Ambulatory Visit
Admission: EM | Admit: 2023-09-30 | Discharge: 2023-09-30 | Disposition: A | Discharge status: Home / Self Care | Payer: 59 | Attending: Physician Assistant | Admitting: Physician Assistant

## 2023-09-30 ENCOUNTER — Ambulatory Visit: Payer: 59 | Admitting: Physician Assistant

## 2023-09-30 ENCOUNTER — Encounter: Payer: Self-pay | Admitting: Emergency Medicine

## 2023-09-30 DIAGNOSIS — H66002 Acute suppurative otitis media without spontaneous rupture of ear drum, left ear: Secondary | ICD-10-CM

## 2023-09-30 DIAGNOSIS — J029 Acute pharyngitis, unspecified: Secondary | ICD-10-CM | POA: Diagnosis not present

## 2023-09-30 LAB — GROUP A STREP BY PCR: Group A Strep by PCR: NOT DETECTED

## 2023-09-30 MED ORDER — AMOXICILLIN-POT CLAVULANATE 875-125 MG PO TABS: 1.0000 | ORAL_TABLET | Freq: Two times a day (BID) | ORAL | 0 refills | Status: DC

## 2023-09-30 NOTE — Discharge Instructions (Addendum)
-  Mucinex D or Sudafed (behind the counter), flonase, ibuprofen and antibiotics -Return if fever or worsening symptoms.

## 2023-09-30 NOTE — ED Provider Notes (Signed)
[facility]    CSN: [id number] Arrival date & time: 09/30/23  1701      History   Chief Complaint Chief Complaint  Patient presents with   Otalgia   Sore Throat    HPI Alicia [name] is a 32 y.o. female presenting for 5-day history of sore throat and left ear pain.  She says the right ear feels full.  Also reports congestion.  Denies fever, cough, shortness of breath, vomiting or diarrhea.  Has been taking over-the-counter medicine without relief.  History of ear infections and ruptured eardrum.  HPI  Past Medical History:  Diagnosis Date   Anemia    Anxiety    Depression    Family history of breast cancer    9/21 cancer genetic testing letter sent   Family history of ovarian cancer     Patient Active Problem List   Diagnosis Date Noted   Migraine without aura and without status migrainosus, not intractable 08/11/2023   History of anemia 08/11/2023   History of cesarean delivery 03/18/2021   S/P cesarean section 08/30/2015   Family history of genetic disease carrier 03/25/2015    Past Surgical History:  Procedure Laterality Date   CESAREAN SECTION N/A 08/30/2015   Procedure: CESAREAN SECTION;  Surgeon: Nadara Mustard, MD;  Location: [facility];  Service: Obstetrics;  Laterality: N/A;   CESAREAN SECTION N/A 03/18/2021   Procedure: CESAREAN SECTION;  Surgeon: Nadara Mustard, MD;  Location: [facility];  Service: Obstetrics;  Laterality: N/A;    OB History     Gravida  2   Para  1   Term  1   Preterm  0   AB  0   Living  1      SAB  0   IAB  0   Ectopic  0   Multiple      Live Births  1            Home Medications    Prior to Admission medications   Medication Sig Start Date End Date Taking? Authorizing Provider  amoxicillin-clavulanate (AUGMENTIN) 875-125 MG tablet Take 1 tablet by mouth every 12 (twelve) hours for 7 days. 09/30/23 10/07/23 Yes Shirlee Latch, PA-C  desogestrel-ethinyl estradiol (CYRED EQ) 0.15-30 MG-MCG  tablet Take 1 tablet by mouth daily at the same time each day, preferably either after the evening meal or at bedtime. 07/15/23  Yes   fluticasone (FLONASE) 50 MCG/ACT nasal spray Place 1 spray into both nostrils in the morning and at bedtime. 12/25/21   Crain, Whitney L, PA  ondansetron (ZOFRAN-ODT) 4 MG disintegrating tablet Take 1 tablet (4 mg total) by mouth every 8 (eight) hours as needed for nausea or vomiting. 09/17/22   Waldon Merl, PA-C  Rimegepant Sulfate (NURTEC) 75 MG TBDP Take 1 tablet (75 mg total) by mouth once as needed for up to 1 dose (for migraine). 08/11/23   Remo Lipps, PA    Family History Family History  Problem Relation Age of Onset   Breast cancer Paternal Aunt 86   Stroke Maternal Grandmother    Diabetes Maternal Grandmother    Diabetes Maternal Grandfather    Pancreatic cancer Maternal Grandfather    Stroke Paternal Grandmother    Hypertension Paternal Grandmother    Heart disease Paternal Grandmother    Diabetes Paternal Grandmother    Ovarian cancer Paternal Grandmother    Diabetes Paternal Grandfather    Ovarian cancer Cousin 30    Social  History Social History   Tobacco Use   Smoking status: Never   Smokeless tobacco: Never  Vaping Use   Vaping status: Never Used  Substance Use Topics   Alcohol use: No   Drug use: No     Allergies   Patient has no known allergies.   Review of Systems Review of Systems  Constitutional:  Negative for chills, diaphoresis, fatigue and fever.  HENT:  Positive for congestion, ear pain, rhinorrhea and sore throat. Negative for sinus pressure and sinus pain.   Respiratory:  Negative for cough and shortness of breath.   Gastrointestinal:  Negative for abdominal pain, nausea and vomiting.  Musculoskeletal:  Negative for arthralgias and myalgias.  Skin:  Negative for rash.  Neurological:  Negative for weakness and headaches.  Hematological:  Negative for adenopathy.     Physical Exam Triage Vital  Signs ED Triage Vitals  Encounter Vitals Group     BP      Systolic BP Percentile      Diastolic BP Percentile      Pulse      Resp      Temp      Temp src      SpO2      Weight      Height      Head Circumference      Peak Flow      Pain Score      Pain Loc      Pain Education      Exclude from Growth Chart    No data found.  Updated Vital Signs BP 117/83 (BP Location: Left Arm)   Pulse (!) 114   Temp 98.2 F (36.8 C) (Oral)   Resp 18   LMP  (LMP Unknown)   SpO2 100%   Physical Exam Vitals and nursing note reviewed.  Constitutional:      General: She is not in acute distress.    Appearance: Normal appearance. She is not ill-appearing or toxic-appearing.  HENT:     Head: Normocephalic and atraumatic.     Right Ear: A middle ear effusion is present.     Left Ear: A middle ear effusion is present. Tympanic membrane is erythematous and bulging.     Nose: Congestion present.     Mouth/Throat:     Mouth: Mucous membranes are moist.     Pharynx: Oropharynx is clear.  Eyes:     General: No scleral icterus.       Right eye: No discharge.        Left eye: No discharge.     Conjunctiva/sclera: Conjunctivae normal.  Cardiovascular:     Rate and Rhythm: Regular rhythm. Tachycardia present.     Heart sounds: Normal heart sounds.  Pulmonary:     Effort: Pulmonary effort is normal. No respiratory distress.     Breath sounds: Normal breath sounds.  Musculoskeletal:     Cervical back: Neck supple.  Skin:    General: Skin is dry.  Neurological:     General: No focal deficit present.     Mental Status: She is alert. Mental status is at baseline.     Motor: No weakness.     Gait: Gait normal.  Psychiatric:        Mood and Affect: Mood normal.        Behavior: Behavior normal.        Thought Content: Thought content normal.      UC Treatments / Results  Labs (all  labs ordered are listed, but only abnormal results are displayed) Labs Reviewed  GROUP A STREP BY  PCR    EKG   Radiology No results found.  Procedures Procedures (including critical care time)  Medications Ordered in UC Medications - No data to display  Initial Impression / Assessment and Plan / UC Course  I have reviewed the triage vital signs and the nursing notes.  Pertinent labs & imaging results that were available during my care of the patient were reviewed by me and considered in my medical decision making (see chart for details).   32 year old female presents with 5-day history of sore throat, congestion and left ear pain.  Ear pain is most severe and has gotten worse.  No fever or cough.  History of ear infections and ruptured eardrum.  A strep test was negative.  On exam she has erythema bulging of the left TM, effusion of bilateral TMs.  Treating for suspected bacterial otitis media with Augmentin.  Also advised Mucinex D or Sudafed, Flonase, ibuprofen, warm compresses, rest and fluids.  Reviewed return precautions.   Final Clinical Impressions(s) / UC Diagnoses   Final diagnoses:  Acute suppurative otitis media of left ear without spontaneous rupture of tympanic membrane, recurrence not specified  Sore throat     Discharge Instructions      -Mucinex D or Sudafed (behind the counter), flonase, ibuprofen and antibiotics -Return if fever or worsening symptoms.      ED Prescriptions     Medication Sig Dispense Auth. Provider   amoxicillin-clavulanate (AUGMENTIN) 875-125 MG tablet Take 1 tablet by mouth every 12 (twelve) hours for 7 days. 14 tablet Gareth Morgan      PDMP not reviewed this encounter.   Shirlee Latch, PA-C 09/30/23 1900

## 2023-09-30 NOTE — ED Triage Notes (Signed)
Pt presents with a sore throat and bilateral ear pain x 5 days.

## 2023-10-01 ENCOUNTER — Telehealth: Payer: Self-pay

## 2023-10-01 ENCOUNTER — Ambulatory Visit: Payer: 59

## 2023-10-01 NOTE — Telephone Encounter (Signed)
Pt went to the ED 09/30/23.  Copied from CRM [id number]. Topic: Appointment Scheduling - Scheduling Inquiry for Clinic >> Sep 30, 2023  3:21 PM Payton Doughty wrote: Reason for CRM: pt made the appt for 4 pm this morning, but had to take daughter to her pediatrician today, she is still there now.  Pt concerned she may miss this appt.  Pt thinks she may have busted ear drum.  She will cb if she sees she is not going to make this appt time.  Pt declined to reschedule for tomorrow, stating she cannot do tomorrow.

## 2023-10-02 ENCOUNTER — Other Ambulatory Visit: Payer: Self-pay | Admitting: Nurse Practitioner

## 2023-10-02 MED ORDER — CEFDINIR 300 MG PO CAPS: 300.0000 mg | ORAL_CAPSULE | Freq: Two times a day (BID) | ORAL | 0 refills | Status: AC

## 2023-10-04 MED ORDER — FLUCONAZOLE 150 MG PO TABS: 150.0000 mg | ORAL_TABLET | ORAL | 0 refills | Status: DC | PRN

## 2023-10-04 NOTE — Addendum Note (Signed)
Addended by: Margaretann Loveless on: 10/04/2023 08:12 AM   Modules accepted: Orders

## 2023-10-08 ENCOUNTER — Other Ambulatory Visit (HOSPITAL_COMMUNITY): Payer: Self-pay

## 2023-10-08 MED ORDER — CYRED EQ 0.15-30 MG-MCG PO TABS
1.0000 | ORAL_TABLET | Freq: Every day | ORAL | 0 refills | Status: DC
  Filled 2023-10-08 – 2023-10-16 (×3): qty 84, 84d supply, fill #0

## 2023-10-12 ENCOUNTER — Other Ambulatory Visit (HOSPITAL_COMMUNITY): Payer: Self-pay

## 2023-10-16 ENCOUNTER — Other Ambulatory Visit (HOSPITAL_COMMUNITY): Payer: Self-pay

## 2023-10-20 ENCOUNTER — Other Ambulatory Visit (HOSPITAL_COMMUNITY): Payer: Self-pay

## 2023-12-14 ENCOUNTER — Other Ambulatory Visit: Payer: Self-pay

## 2023-12-17 ENCOUNTER — Other Ambulatory Visit (HOSPITAL_COMMUNITY): Payer: Self-pay

## 2023-12-17 MED ORDER — CYRED EQ 0.15-30 MG-MCG PO TABS
1.0000 | ORAL_TABLET | Freq: Every day | ORAL | 0 refills | Status: DC
  Filled 2023-12-17 – 2023-12-24 (×2): qty 84, 84d supply, fill #0

## 2023-12-24 ENCOUNTER — Other Ambulatory Visit (HOSPITAL_COMMUNITY): Payer: Self-pay

## 2024-01-08 ENCOUNTER — Telehealth: Payer: Commercial Managed Care - PPO | Admitting: Physician Assistant

## 2024-01-08 ENCOUNTER — Other Ambulatory Visit (HOSPITAL_COMMUNITY): Payer: Self-pay

## 2024-01-08 DIAGNOSIS — R300 Dysuria: Secondary | ICD-10-CM

## 2024-01-08 NOTE — Progress Notes (Signed)

## 2024-01-12 ENCOUNTER — Other Ambulatory Visit (HOSPITAL_COMMUNITY): Payer: Self-pay

## 2024-01-13 ENCOUNTER — Other Ambulatory Visit (HOSPITAL_COMMUNITY): Payer: Self-pay

## 2024-02-29 ENCOUNTER — Other Ambulatory Visit (HOSPITAL_COMMUNITY): Payer: Self-pay

## 2024-03-15 ENCOUNTER — Other Ambulatory Visit (HOSPITAL_COMMUNITY): Payer: Self-pay

## 2024-03-15 MED ORDER — CYRED EQ 0.15-30 MG-MCG PO TABS
1.0000 | ORAL_TABLET | Freq: Every day | ORAL | 0 refills | Status: DC
  Filled 2024-03-15 (×2): qty 28, 28d supply, fill #0

## 2024-03-21 ENCOUNTER — Telehealth: Admitting: Family Medicine

## 2024-03-21 DIAGNOSIS — H579 Unspecified disorder of eye and adnexa: Secondary | ICD-10-CM

## 2024-03-21 DIAGNOSIS — J3081 Allergic rhinitis due to animal (cat) (dog) hair and dander: Secondary | ICD-10-CM | POA: Diagnosis not present

## 2024-03-21 DIAGNOSIS — R0989 Other specified symptoms and signs involving the circulatory and respiratory systems: Secondary | ICD-10-CM | POA: Diagnosis not present

## 2024-03-21 NOTE — Progress Notes (Signed)
 E visit for Allergic Rhinitis We are sorry that you are not feeling well.  Here is how we plan to help!  Based on what you have shared with me it looks like you have Allergic Rhinitis.  Rhinitis is when a reaction occurs that causes nasal congestion, runny nose, sneezing, and itching.  Most types of rhinitis are caused by an inflammation and are associated with symptoms in the eyes ears or throat. There are several types of rhinitis.  The most common are acute rhinitis, which is usually caused by a viral illness, allergic or seasonal rhinitis, and nonallergic or year-round rhinitis.  Nasal allergies occur certain times of the year.  Allergic rhinitis is caused when allergens in the air trigger the release of histamine in the body.  Histamine causes itching, swelling, and fluid to build up in the fragile linings of the nasal passages, sinuses and eyelids.  An itchy nose and clear discharge are common.  I recommend the following over the counter treatments: Xyzal 5 mg take 1 tablet daily  I also would recommend a nasal spray: Saline 1 spray into each nostril as needed  You may also benefit from eye drops such as: Systane 1-2 driops each eye twice daily as needed  HOME CARE:  You can use an over-the-counter saline nasal spray as needed Avoid areas where there is heavy dust, mites, or molds Stay indoors on windy days during the pollen season Keep windows closed in home, at least in bedroom; use air conditioner. Use high-efficiency house air filter Keep windows closed in car, turn AC on re-circulate Avoid playing out with dog during pollen season  GET HELP RIGHT AWAY IF:  If your symptoms do not improve within 10 days You become short of breath You develop yellow or green discharge from your nose for over 3 days You have coughing fits  MAKE SURE YOU:  Understand these instructions Will watch your condition Will get help right away if you are not doing well or get worse  Thank you for  choosing an e-visit. Your e-visit answers were reviewed by a board certified advanced clinical practitioner to complete your personal care plan. Depending upon the condition, your plan could have included both over the counter or prescription medications. Please review your pharmacy choice. Be sure that the pharmacy you have chosen is open so that you can pick up your prescription now.  If there is a problem you may message your provider in MyChart to have the prescription routed to another pharmacy. Your safety is important to us . If you have drug allergies check your prescription carefully.  For the next 24 hours, you can use MyChart to ask questions about today's visit, request a non-urgent call back, or ask for a work or school excuse from your e-visit provider. You will get an email in the next two days asking about your experience. I hope that your e-visit has been valuable and will speed your recovery.       I provided 5 minutes of non face-to-face time during this encounter for chart review, medication and order placement, as well as and documentation.

## 2024-04-06 ENCOUNTER — Other Ambulatory Visit (HOSPITAL_COMMUNITY): Payer: Self-pay

## 2024-04-06 MED ORDER — CYRED EQ 0.15-30 MG-MCG PO TABS
1.0000 | ORAL_TABLET | Freq: Every day | ORAL | 0 refills | Status: DC
  Filled 2024-04-06: qty 84, 84d supply, fill #0

## 2024-04-13 ENCOUNTER — Other Ambulatory Visit (HOSPITAL_COMMUNITY): Payer: Self-pay

## 2024-04-13 ENCOUNTER — Telehealth: Admitting: Physician Assistant

## 2024-04-13 DIAGNOSIS — J0140 Acute pansinusitis, unspecified: Secondary | ICD-10-CM

## 2024-04-13 MED ORDER — FLUTICASONE PROPIONATE 50 MCG/ACT NA SUSP
2.0000 | Freq: Every day | NASAL | 0 refills | Status: DC
  Filled 2024-04-13: qty 16, 30d supply, fill #0

## 2024-04-13 MED ORDER — PREDNISONE 20 MG PO TABS
40.0000 mg | ORAL_TABLET | Freq: Every day | ORAL | 0 refills | Status: DC
  Filled 2024-04-13: qty 10, 5d supply, fill #0

## 2024-04-13 NOTE — Progress Notes (Signed)
 E-Visit for Sinus Problems  We are sorry that you are not feeling well.  Here is how we plan to help!  Based on what you have shared with me it looks like you have sinusitis.  Sinusitis is inflammation and infection in the sinus cavities of the head.  Based on your presentation I believe you most likely have Acute Viral Sinusitis.This is an infection most likely caused by a virus. There is not specific treatment for viral sinusitis other than to help you with the symptoms until the infection runs its course.  You may use an oral decongestant such as Mucinex D or if you have glaucoma or high blood pressure use plain Mucinex. Saline nasal spray help and can safely be used as often as needed for congestion, I have prescribed: Fluticasone  nasal spray two sprays in each nostril once a day and Prednisone  20mg  Take 2 tablets (40mg ) daily for 5 days.   Some authorities believe that zinc sprays or the use of Echinacea may shorten the course of your symptoms.  Sinus infections are not as easily transmitted as other respiratory infection, however we still recommend that you avoid close contact with loved ones, especially the very young and elderly.  Remember to wash your hands thoroughly throughout the day as this is the number one way to prevent the spread of infection!  Home Care: Only take medications as instructed by your medical team. Do not take these medications with alcohol. A steam or ultrasonic humidifier can help congestion.  You can place a towel over your head and breathe in the steam from hot water coming from a faucet. Avoid close contacts especially the very young and the elderly. Cover your mouth when you cough or sneeze. Always remember to wash your hands.  Get Help Right Away If: You develop worsening fever or sinus pain. You develop a severe head ache or visual changes. Your symptoms persist after you have completed your treatment plan.  Make sure you Understand these  instructions. Will watch your condition. Will get help right away if you are not doing well or get worse.   Thank you for choosing an e-visit.  Your e-visit answers were reviewed by a board certified advanced clinical practitioner to complete your personal care plan. Depending upon the condition, your plan could have included both over the counter or prescription medications.  Please review your pharmacy choice. Make sure the pharmacy is open so you can pick up prescription now. If there is a problem, you may contact your provider through [employer] and have the prescription routed to another pharmacy.  Your safety is important to us . If you have drug allergies check your prescription carefully.   For the next 24 hours you can use MyChart to ask questions about today's visit, request a non-urgent call back, or ask for a work or school excuse. You will get an email in the next two days asking about your experience. I hope that your e-visit has been valuable and will speed your recovery.    I have spent 5 minutes in review of e-visit questionnaire, review and updating patient chart, medical decision making and response to patient.   Angelia Kelp, PA-C

## 2024-06-01 ENCOUNTER — Other Ambulatory Visit (HOSPITAL_COMMUNITY): Payer: Self-pay

## 2024-06-01 MED ORDER — CYRED EQ 0.15-30 MG-MCG PO TABS
1.0000 | ORAL_TABLET | Freq: Every day | ORAL | 0 refills | Status: DC
  Filled 2024-06-01 – 2024-06-20 (×3): qty 84, 84d supply, fill #0

## 2024-06-06 ENCOUNTER — Other Ambulatory Visit (HOSPITAL_COMMUNITY): Payer: Self-pay

## 2024-06-06 ENCOUNTER — Telehealth: Admitting: Physician Assistant

## 2024-06-06 DIAGNOSIS — R3989 Other symptoms and signs involving the genitourinary system: Secondary | ICD-10-CM

## 2024-06-06 MED ORDER — FLUCONAZOLE 150 MG PO TABS
ORAL_TABLET | ORAL | 0 refills | Status: DC
  Filled 2024-06-06: qty 2, 3d supply, fill #0

## 2024-06-06 MED ORDER — CEPHALEXIN 500 MG PO CAPS
500.0000 mg | ORAL_CAPSULE | Freq: Two times a day (BID) | ORAL | 0 refills | Status: AC
  Filled 2024-06-06: qty 14, 7d supply, fill #0

## 2024-06-06 NOTE — Progress Notes (Signed)
 I have spent 5 minutes in review of e-visit questionnaire, review and updating patient chart, medical decision making and response to patient.   Piedad Climes, PA-C

## 2024-06-06 NOTE — Progress Notes (Signed)
 E-Visit for Urinary Problems  We are sorry that you are not feeling well.  Here is how we plan to help!  Based on what you shared with me it looks like you most likely have a simple urinary tract infection.  A UTI (Urinary Tract Infection) is a bacterial infection of the bladder.  Most cases of urinary tract infections are simple to treat but a key part of your care is to encourage you to drink plenty of fluids and watch your symptoms carefully.  I have prescribed Keflex  500 mg twice a day for 7 days.  Your symptoms should gradually improve. Call us  if the burning in your urine worsens, you develop worsening fever, back pain or pelvic pain or if your symptoms do not resolve after completing the antibiotic.  I have sent in a course of Diflucan  in case of antibiotic-induced yeast.  Urinary tract infections can be prevented by drinking plenty of water to keep your body hydrated.  Also be sure when you wipe, wipe from front to back and don't hold it in!  If possible, empty your bladder every 4 hours.  HOME CARE Drink plenty of fluids Compete the full course of the antibiotics even if the symptoms resolve Remember, when you need to go.go. Holding in your urine can increase the likelihood of getting a UTI! GET HELP RIGHT AWAY IF: You cannot urinate You get a high fever Worsening back pain occurs You see blood in your urine You feel sick to your stomach or throw up You feel like you are going to pass out  MAKE SURE YOU  Understand these instructions. Will watch your condition. Will get help right away if you are not doing well or get worse.   Thank you for choosing an e-visit.  Your e-visit answers were reviewed by a board certified advanced clinical practitioner to complete your personal care plan. Depending upon the condition, your plan could have included both over the counter or prescription medications.  Please review your pharmacy choice. Make sure the pharmacy is open so you can  pick up prescription now. If there is a problem, you may contact your provider through [employer] and have the prescription routed to another pharmacy.  Your safety is important to us . If you have drug allergies check your prescription carefully.   For the next 24 hours you can use MyChart to ask questions about today's visit, request a non-urgent call back, or ask for a work or school excuse. You will get an email in the next two days asking about your experience. I hope that your e-visit has been valuable and will speed your recovery.

## 2024-06-14 ENCOUNTER — Other Ambulatory Visit (HOSPITAL_COMMUNITY): Payer: Self-pay

## 2024-06-20 ENCOUNTER — Other Ambulatory Visit (HOSPITAL_COMMUNITY): Payer: Self-pay

## 2024-08-18 ENCOUNTER — Telehealth: Admitting: Physician Assistant

## 2024-08-18 ENCOUNTER — Other Ambulatory Visit (HOSPITAL_COMMUNITY): Payer: Self-pay

## 2024-08-18 DIAGNOSIS — G43801 Other migraine, not intractable, with status migrainosus: Secondary | ICD-10-CM

## 2024-08-18 MED ORDER — PREDNISONE 10 MG (21) PO TBPK
ORAL_TABLET | ORAL | 0 refills | Status: DC
  Filled 2024-08-18: qty 21, 6d supply, fill #0

## 2024-08-18 NOTE — Progress Notes (Signed)
 Virtual Visit Consent   Alicia [name], you are scheduled for a virtual visit with a [facility] provider today. Just as with appointments in the office, your consent must be obtained to participate. Your consent will be active for this visit and any virtual visit you may have with one of our providers in the next 365 days. If you have a MyChart account, a copy of this consent can be sent to you electronically.  As this is a virtual visit, video technology does not allow for your provider to perform a traditional examination. This may limit your provider's ability to fully assess your condition. If your provider identifies any concerns that need to be evaluated in person or the need to arrange testing (such as labs, EKG, etc.), we will make arrangements to do so. Although advances in technology are sophisticated, we cannot ensure that it will always work on either your end or our end. If the connection with a video visit is poor, the visit may have to be switched to a telephone visit. With either a video or telephone visit, we are not always able to ensure that we have a secure connection.  By engaging in this virtual visit, you consent to the provision of healthcare and authorize for your insurance to be billed (if applicable) for the services provided during this visit. Depending on your insurance coverage, you may receive a charge related to this service.  I need to obtain your verbal consent now. Are you willing to proceed with your visit today? Alicia [name] has provided verbal consent on 08/18/2024 for a virtual visit (video or telephone). Alicia CHRISTELLA Dickinson, PA-C  Date: 08/18/2024 10:51 AM   Virtual Visit via Video Note   I, Alicia [name], connected with  Alicia [name]  ([id number], 30-Jun-1991) on 08/18/24 at 10:45 AM EDT by a video-enabled telemedicine application and verified that I am speaking with the correct person using two identifiers.  Location: Patient: Virtual Visit  Location Patient: Mobile Provider: Virtual Visit Location Provider: Home Office   I discussed the limitations of evaluation and management by telemedicine and the availability of in person appointments. The patient expressed understanding and agreed to proceed.    History of Present Illness: Alicia [name] is a 33 y.o. who identifies as a female who was assigned female at birth, and is being seen today for migraine.  HPI: Migraine  This is a new problem. The current episode started in the past 7 days (2-3 days). The problem occurs constantly. The problem has been unchanged. The pain is located in the Temporal and left unilateral region. The pain does not radiate. The pain quality is similar to prior headaches. The quality of the pain is described as pulsating and throbbing. The pain is moderate. Associated symptoms include eye watering (last night), nausea (yesterday), photophobia, tingling (this [name] on face) and vomiting (yesterday). Pertinent negatives include no blurred vision, dizziness, ear pain, eye pain, eye redness, fever, hearing loss, loss of balance, muscle aches, numbness, phonophobia, scalp tenderness, tinnitus, visual change or weakness. The symptoms are aggravated by emotional stress. She has tried Excedrin and NSAIDs (Nurtec) for the symptoms. The treatment provided mild relief. Her past medical history is significant for migraine headaches.     Problems:  Patient Active Problem List   Diagnosis Date Noted   Migraine without aura and without status migrainosus, not intractable 08/11/2023   History of anemia 08/11/2023   History of cesarean delivery 03/18/2021   S/P cesarean  section 08/30/2015   Family history of genetic disease carrier 03/25/2015    Allergies: No Known Allergies Medications:  Current Outpatient Medications:    predniSONE  (STERAPRED UNI-PAK 21 TAB) 10 MG (21) TBPK tablet, 6 day taper; take as directed on package instructions, Disp: 21 tablet, Rfl: 0    desogestrel -ethinyl estradiol  (CYRED EQ ) 0.15-30 MG-MCG tablet, Take 1 tablet by mouth daily at the same time each day, preferably either after the evening meal or at bedtime., Disp: 84 tablet, Rfl: 0   fluconazole  (DIFLUCAN ) 150 MG tablet, Take 1 tablet by mouth once. Repeat in 3 days if needed., Disp: 2 tablet, Rfl: 0   fluticasone  (FLONASE ) 50 MCG/ACT nasal spray, Place 2 sprays into both nostrils daily., Disp: 16 g, Rfl: 0   ondansetron  (ZOFRAN -ODT) 4 MG disintegrating tablet, Take 1 tablet (4 mg total) by mouth every 8 (eight) hours as needed for nausea or vomiting., Disp: 20 tablet, Rfl: 0   Rimegepant Sulfate (NURTEC) 75 MG TBDP, Take 1 tablet (75 mg total) by mouth once as needed for up to 1 dose (for migraine)., Disp: 4 tablet, Rfl:   Observations/Objective: Patient is well-developed, well-nourished in no acute distress.  Resting comfortably at home.  Head is normocephalic, atraumatic.  No labored breathing.  Speech is clear and coherent with logical content.  Patient is alert and oriented at baseline.    Assessment and Plan: 1. Other migraine with status migrainosus, not intractable (Primary) - predniSONE  (STERAPRED UNI-PAK 21 TAB) 10 MG (21) TBPK tablet; 6 day taper; take as directed on package instructions  Dispense: 21 tablet; Refill: 0  - Migraine not resolving completely over 3 days - Prednisone  added - Push fluids - Rest as needed - Follow up in person if not improving or if worsens at all  Follow Up Instructions: I discussed the assessment and treatment plan with the patient. The patient was provided an opportunity to ask questions and all were answered. The patient agreed with the plan and demonstrated an understanding of the instructions.  A copy of instructions were sent to the patient via MyChart unless otherwise noted below.    The patient was advised to call back or seek an in-person evaluation if the symptoms worsen or if the condition fails to improve as  anticipated.    Alicia CHRISTELLA Dickinson, PA-C

## 2024-08-18 NOTE — Patient Instructions (Signed)
 Alicia [name], thank you for joining Delon CHRISTELLA Dickinson, PA-C for today's virtual visit.  While this provider is not your primary care provider (PCP), if your PCP is located in our provider database this encounter information will be shared with them immediately following your visit.   A [facility] MyChart account gives you access to today's visit and all your visits, tests, and labs performed at [facility]  click here if you don't have a [facility] MyChart account or go to [email or web address]  Consent: (Patient) Alicia [name] provided verbal consent for this virtual visit at the beginning of the encounter.  Current Medications:  Current Outpatient Medications:    predniSONE  (STERAPRED UNI-PAK 21 TAB) 10 MG (21) TBPK tablet, 6 day taper; take as directed on package instructions, Disp: 21 tablet, Rfl: 0   desogestrel -ethinyl estradiol  (CYRED EQ ) 0.15-30 MG-MCG tablet, Take 1 tablet by mouth daily at the same time each day, preferably either after the evening meal or at bedtime., Disp: 84 tablet, Rfl: 0   fluconazole  (DIFLUCAN ) 150 MG tablet, Take 1 tablet by mouth once. Repeat in 3 days if needed., Disp: 2 tablet, Rfl: 0   fluticasone  (FLONASE ) 50 MCG/ACT nasal spray, Place 2 sprays into both nostrils daily., Disp: 16 g, Rfl: 0   ondansetron  (ZOFRAN -ODT) 4 MG disintegrating tablet, Take 1 tablet (4 mg total) by mouth every 8 (eight) hours as needed for nausea or vomiting., Disp: 20 tablet, Rfl: 0   Rimegepant Sulfate (NURTEC) 75 MG TBDP, Take 1 tablet (75 mg total) by mouth once as needed for up to 1 dose (for migraine)., Disp: 4 tablet, Rfl:    Medications ordered in this encounter:  Meds ordered this encounter  Medications   predniSONE  (STERAPRED UNI-PAK 21 TAB) 10 MG (21) TBPK tablet    Sig: 6 day taper; take as directed on package instructions    Dispense:  21 tablet    Refill:  0    Supervising Provider:   BLAISE ALEENE KIDD [[id number]]     *If you need  refills on other medications prior to your next appointment, please contact your pharmacy*  Follow-Up: Call back or seek an in-person evaluation if the symptoms worsen or if the condition fails to improve as anticipated.  [facility] [phone]  Other Instructions Migraine Headache A migraine headache is an intense pulsing or throbbing pain on one or both sides of the head. Migraine headaches may also cause other symptoms, such as nausea, vomiting, and sensitivity to light and noise. A migraine headache can last from 4 hours to 3 days. Talk with your health care provider about what things may bring on (trigger) your migraine headaches. What are the causes? The exact cause is not known. However, a migraine may be caused when nerves in the brain get irritated and release chemicals that cause blood vessels to become inflamed. This inflammation causes pain. Migraines may be triggered or caused by: Smoking. Medicines, such as: Nitroglycerin, which is used to treat chest pain. Birth control pills. Estrogen. Certain blood pressure medicines. Foods or drinks that contain nitrates, glutamate, aspartame, MSG, or tyramine. Certain foods or drinks, such as aged cheeses, chocolate, alcohol, or caffeine. Doing physical activity that is very hard. Other triggers may include: Menstruation. Pregnancy. Hunger. Stress. Getting too much or too little sleep. Weather changes. Tiredness (fatigue). What increases the risk? The following factors may make you more likely to have migraine headaches: Being between the ages of 55-58 years old. Being female.  Having a family history of migraine headaches. Being Caucasian. Having a mental health condition, such as depression or anxiety. Being obese. What are the signs or symptoms? The main symptom of this condition is pulsing or throbbing pain. This pain may: Happen in any area of the head, such as on one or both sides. Make it hard to do  daily activities. Get worse with physical activity. Get worse around bright lights, loud noises, or smells. Other symptoms may include: Nausea. Vomiting. Dizziness. Before a migraine headache starts, you may get warning signs (an aura). An aura may include: Seeing flashing lights or having blind spots. Seeing bright spots, halos, or zigzag lines. Having tunnel vision or blurred vision. Having numbness or a tingling feeling. Having trouble talking. Having muscle weakness. After a migraine ends, you may have symptoms. These may include: Feeling tired. Trouble concentrating. How is this diagnosed? A migraine headache can be diagnosed based on: Your symptoms. A physical exam. Tests, such as: A CT scan or an MRI of the head. These tests can help rule out other causes of headaches. Taking fluid from the spine (lumbar puncture) to examine it (cerebrospinal fluid analysis, or CSF analysis). How is this treated? This condition may be treated with medicines that: Relieve pain and nausea. Prevent migraines. Treatment may also include: Acupuncture. Lifestyle changes like avoiding foods that trigger migraine headaches. Learning ways to control your body (biofeedback). Talk therapy to help you know and deal with negative thoughts (cognitive behavioral therapy). Follow these instructions at home: Medicines Take over-the-counter and prescription medicines only as told by your provider. Ask your provider if the medicine prescribed to you: Requires you to avoid driving or using machinery. Can cause constipation. You may need to take these actions to prevent or treat constipation: Drink enough fluid to keep your pee (urine) pale yellow. Take over-the-counter or prescription medicines. Eat foods that are high in fiber, such as beans, whole grains, and fresh fruits and vegetables. Limit foods that are high in fat and processed sugars, such as fried or sweet foods. Lifestyle  Do not drink  alcohol. Do not use any products that contain nicotine or tobacco. These products include cigarettes, chewing tobacco, and vaping devices, such as e-cigarettes. If you need help quitting, ask your provider. Get 7-9 hours of sleep each night, or the amount recommended by your provider. Find ways to manage stress, such as meditation, deep breathing, or yoga. Try to exercise regularly. This can help lessen how bad and how often your migraines occur. General instructions Keep a journal to find out what triggers your migraines, so you can avoid those things. For example, write down: What you eat and drink. How much sleep you get. Any change to your diet or medicines. If you have a migraine headache: Avoid things that make your symptoms worse, such as bright lights. Lie down in a dark, quiet room. Do not drive or use machinery. Ask your provider what activities are safe for you while you have symptoms. Keep all follow-up visits. Your provider will monitor your symptoms and recommend any further treatment. Where to find more information Coalition for Headache and Migraine Patients (CHAMP): headachemigraine.org American Migraine Foundation: americanmigrainefoundation.org National Headache Foundation: headaches.org Contact a health care provider if: You have symptoms that are different or worse than your usual migraine headache symptoms. You have more than 15 days of headaches in one month. Get help right away if: Your migraine headache becomes severe or lasts more than 72 hours. You have a fever  or stiff neck. You have vision loss. Your muscles feel weak or like you cannot control them. You lose your balance often or have trouble walking. You faint. You have a seizure. This information is not intended to replace advice given to you by your health care provider. Make sure you discuss any questions you have with your health care provider. Document Revised: 06/29/2022 Document Reviewed:  06/29/2022 Elsevier Patient Education  2024 Elsevier Inc.   If you have been instructed to have an in-person evaluation today at a local Urgent Care facility, please use the link below. It will take you to a list of all of our available [facility], including address, phone number and hours of operation. Please do not delay care.  [facility]  If you or a family member do not have a primary care provider, use the link below to schedule a visit and establish care. When you choose a [facility] primary care physician or advanced practice provider, you gain a long-term partner in health. Find a Primary Care Provider  Learn more about [facility]'s in-office and virtual care options: [facility] - Get Care Now

## 2024-08-23 ENCOUNTER — Other Ambulatory Visit: Payer: Self-pay

## 2024-08-23 ENCOUNTER — Other Ambulatory Visit (HOSPITAL_COMMUNITY): Payer: Self-pay

## 2024-08-23 MED ORDER — CYRED EQ 0.15-30 MG-MCG PO TABS
1.0000 | ORAL_TABLET | Freq: Every day | ORAL | 0 refills | Status: DC
  Filled 2024-08-23: qty 84, 84d supply, fill #0
  Filled 2024-08-23: qty 28, 21d supply, fill #0
  Filled 2024-08-23 – 2024-08-25 (×2): qty 84, 84d supply, fill #0

## 2024-08-25 ENCOUNTER — Encounter (HOSPITAL_COMMUNITY): Payer: Self-pay

## 2024-08-25 ENCOUNTER — Other Ambulatory Visit (HOSPITAL_COMMUNITY): Payer: Self-pay

## 2024-09-26 ENCOUNTER — Other Ambulatory Visit (HOSPITAL_COMMUNITY): Payer: Self-pay

## 2024-09-26 ENCOUNTER — Telehealth: Admitting: Physician Assistant

## 2024-09-26 DIAGNOSIS — B9689 Other specified bacterial agents as the cause of diseases classified elsewhere: Secondary | ICD-10-CM

## 2024-09-26 DIAGNOSIS — J019 Acute sinusitis, unspecified: Secondary | ICD-10-CM | POA: Diagnosis not present

## 2024-09-26 MED ORDER — IPRATROPIUM BROMIDE 0.03 % NA SOLN
2.0000 | Freq: Two times a day (BID) | NASAL | 0 refills | Status: AC
  Filled 2024-09-26: qty 30, 43d supply, fill #0

## 2024-09-26 MED ORDER — FLUCONAZOLE 150 MG PO TABS
ORAL_TABLET | ORAL | 0 refills | Status: AC
  Filled 2024-09-26: qty 2, 3d supply, fill #0

## 2024-09-26 MED ORDER — AMOXICILLIN-POT CLAVULANATE 875-125 MG PO TABS
1.0000 | ORAL_TABLET | Freq: Two times a day (BID) | ORAL | 0 refills | Status: AC
  Filled 2024-09-26: qty 14, 7d supply, fill #0

## 2024-09-26 NOTE — Progress Notes (Signed)
 E-Visit for Sinus Problems  We are sorry that you are not feeling well.  Here is how we plan to help!  Based on what you have shared with me it looks like you have sinusitis and acute middle ear infection.  Sinusitis is inflammation and infection in the sinus cavities of the head.  Based on your presentation I believe you most likely have Acute Bacterial Sinusitis.  This is an infection caused by bacteria and is treated with antibiotics. I have prescribed Augmentin  875mg /125mg  one tablet twice daily with food, for 7 days. and I have also prescribed Ipratropium Bromide  Nasal Spray Use 1 spray in each nostril twice daily as needed for drainage; discontinue if too drying You may use an oral decongestant such as Mucinex D or if you have glaucoma or high blood pressure use plain Mucinex. Saline nasal spray help and can safely be used as often as needed for congestion.  If you develop worsening sinus pain, fever or notice severe headache and vision changes, or if symptoms are not better after completion of antibiotic, please schedule an appointment with a health care provider.    Sinus infections are not as easily transmitted as other respiratory infection, however we still recommend that you avoid close contact with loved ones, especially the very young and elderly.  Remember to wash your hands thoroughly throughout the day as this is the number one way to prevent the spread of infection!  Home Care: Only take medications as instructed by your medical team. Complete the entire course of an antibiotic. Do not take these medications with alcohol. A steam or ultrasonic humidifier can help congestion.  You can place a towel over your head and breathe in the steam from hot water coming from a faucet. Avoid close contacts especially the very young and the elderly. Cover your mouth when you cough or sneeze. Always remember to wash your hands.  Get Help Right Away If: You develop worsening fever or sinus  pain. You develop a severe head ache or visual changes. Your symptoms persist after you have completed your treatment plan.  Make sure you Understand these instructions. Will watch your condition. Will get help right away if you are not doing well or get worse.  Your e-visit answers were reviewed by a board certified advanced clinical practitioner to complete your personal care plan.  Depending on the condition, your plan could have included both over the counter or prescription medications.  If there is a problem please reply  once you have received a response from your provider.  Your safety is important to us .  If you have drug allergies check your prescription carefully.    You can use MyChart to ask questions about today's visit, request a non-urgent call back, or ask for a work or school excuse for 24 hours related to this e-Visit. If it has been greater than 24 hours you will need to follow up with your provider, or enter a new e-Visit to address those concerns.  You will get an e-mail in the next two days asking about your experience.  I hope that your e-visit has been valuable and will speed your recovery. Thank you for using e-visits.  I have spent 5 minutes in review of e-visit questionnaire, review and updating patient chart, medical decision making and response to patient.   Elsie Velma Lunger, PA-C

## 2024-09-26 NOTE — Addendum Note (Signed)
 Addended by: GLADIS ELSIE BROCKS on: 09/26/2024 11:23 AM   Modules accepted: Orders

## 2024-09-27 ENCOUNTER — Ambulatory Visit
Admission: EM | Admit: 2024-09-27 | Discharge: 2024-09-27 | Disposition: A | Discharge status: Home / Self Care | Attending: Family Medicine | Admitting: Family Medicine

## 2024-09-27 DIAGNOSIS — J069 Acute upper respiratory infection, unspecified: Secondary | ICD-10-CM

## 2024-09-27 DIAGNOSIS — H66001 Acute suppurative otitis media without spontaneous rupture of ear drum, right ear: Secondary | ICD-10-CM

## 2024-09-27 LAB — POC COVID19/FLU A&B COMBO
Covid Antigen, POC: NEGATIVE
Influenza A Antigen, POC: NEGATIVE
Influenza B Antigen, POC: NEGATIVE

## 2024-09-27 LAB — POCT RAPID STREP A (OFFICE): Rapid Strep A Screen: NEGATIVE

## 2024-09-27 MED ORDER — PROMETHAZINE-DM 6.25-15 MG/5ML PO SYRP: 5.0000 mL | ORAL_SOLUTION | Freq: Four times a day (QID) | ORAL | 0 refills | Status: AC | PRN

## 2024-09-27 NOTE — Discharge Instructions (Addendum)
 Your COVID, infleunza and strep tests are negative.  Take the Augmentin  twice a day for 7 days, as previously prescribed. Stop by the pharmacy to pick up your prescriptions.  Follow up with your primary care provider or return to the urgent care, if not improving.

## 2024-09-27 NOTE — ED Provider Notes (Signed)
 [facility]    CSN: [id number] Arrival date & time: 09/27/24  1047      History   Chief Complaint Chief Complaint  Patient presents with   Sore Throat   Otalgia   Cough    HPI Alicia [name] is a 33 y.o. female.   HPI  History obtained from the patient. Alicia [name] presents for cough with bright green mucus, right ear pain for 2 days ago.  She did an E-visit and was started on Augmentin  yesterday.  Has sore throat,  nasal congestion and rhinorrhea. No vomiting, diarrhea or difficulty breathing.  Notes that her kids have been sick and her daughter was diagnosed with pneumonia.      Past Medical History:  Diagnosis Date   Anemia    Anxiety    Depression    Family history of breast cancer    9/21 cancer genetic testing letter sent   Family history of ovarian cancer     Patient Active Problem List   Diagnosis Date Noted   Migraine without aura and without status migrainosus, not intractable 08/11/2023   History of anemia 08/11/2023   History of cesarean delivery 03/18/2021   S/P cesarean section 08/30/2015   Family history of genetic disease carrier 03/25/2015    Past Surgical History:  Procedure Laterality Date   CESAREAN SECTION N/A 08/30/2015   Procedure: CESAREAN SECTION;  Surgeon: Lamar SHAUNNA Lesches, MD;  Location: [facility];  Service: Obstetrics;  Laterality: N/A;   CESAREAN SECTION N/A 03/18/2021   Procedure: CESAREAN SECTION;  Surgeon: Lesches Lamar SHAUNNA, MD;  Location: [facility];  Service: Obstetrics;  Laterality: N/A;    OB History     Gravida  2   Para  1   Term  1   Preterm  0   AB  0   Living  1      SAB  0   IAB  0   Ectopic  0   Multiple      Live Births  1            Home Medications    Prior to Admission medications   Medication Sig Start Date End Date Taking? Authorizing Provider  promethazine -dextromethorphan (PROMETHAZINE -DM) 6.25-15 MG/5ML syrup Take 5 mLs by mouth 4 (four) times daily as needed. 09/27/24   Yes Estephani Popper, DO  amoxicillin -clavulanate (AUGMENTIN ) 875-125 MG tablet Take 1 tablet by mouth 2 (two) times daily. 09/26/24   Gladis Elsie BROCKS, PA-C  desogestrel -ethinyl estradiol  (CYRED EQ ) 0.15-30 MG-MCG tablet Take 1 tablet taken by mouth at the same time each day, preferably either after the evening meal or at bedtime. 08/23/24     fluconazole  (DIFLUCAN ) 150 MG tablet Take 1 tablet by mouth once. Repeat in 3 days if needed. 09/26/24   Gladis Elsie BROCKS, PA-C  ipratropium (ATROVENT ) 0.03 % nasal spray Place 2 sprays into both nostrils every 12 (twelve) hours. 09/26/24   Gladis Elsie BROCKS, PA-C  Rimegepant Sulfate (NURTEC) 75 MG TBDP Take 1 tablet (75 mg total) by mouth once as needed for up to 1 dose (for migraine). 08/11/23   Manya Toribio SHAUNNA, PA    Family History Family History  Problem Relation Age of Onset   Breast cancer Paternal Aunt 36   Stroke Maternal Grandmother    Diabetes Maternal Grandmother    Diabetes Maternal Grandfather    Pancreatic cancer Maternal Grandfather    Stroke Paternal Grandmother    Hypertension Paternal Grandmother    Heart disease  Paternal Grandmother    Diabetes Paternal Grandmother    Ovarian cancer Paternal Grandmother    Diabetes Paternal Grandfather    Ovarian cancer Cousin 30    Social History Social History   Tobacco Use   Smoking status: Never   Smokeless tobacco: Never  Vaping Use   Vaping status: Never Used  Substance Use Topics   Alcohol use: No   Drug use: No     Allergies   Patient has no known allergies.   Review of Systems Review of Systems: negative unless otherwise stated in HPI.      Physical Exam Triage Vital Signs ED Triage Vitals  Encounter Vitals Group     BP      Girls Systolic BP Percentile      Girls Diastolic BP Percentile      Boys Systolic BP Percentile      Boys Diastolic BP Percentile      Pulse      Resp      Temp      Temp src      SpO2      Weight      Height      Head  Circumference      Peak Flow      Pain Score      Pain Loc      Pain Education      Exclude from Growth Chart    No data found.  Updated Vital Signs BP 112/80 (BP Location: Left Arm)   Pulse 90   Temp 98.8 F (37.1 C) (Oral)   Resp 18   Wt 61.2 kg   LMP  (LMP Unknown)   SpO2 98%   BMI 24.67 kg/m   Visual Acuity Right Eye Distance:   Left Eye Distance:   Bilateral Distance:    Right Eye Near:   Left Eye Near:    Bilateral Near:     Physical Exam GEN:     alert, non-toxic appearing female in no distress    HENT:  mucus membranes moist, oropharyngeal without lesions, mild erythema, no tonsillar hypertrophy or exudates, clear nasal discharge, left TM normal, right TM opaque and erythematous EYES:   pupils equal and reactive, no scleral injection or discharge NECK:  normal ROM, no lymphadenopathy, no meningismus   RESP:  no increased work of breathing, clear to auscultation bilaterally CVS:   regular rate and rhythm Skin:   warm and dry, no rash on visible skin    UC Treatments / Results  Labs (all labs ordered are listed, but only abnormal results are displayed) Labs Reviewed  POCT RAPID STREP A (OFFICE) - Normal  POC COVID19/FLU A&B COMBO - Normal    EKG   Radiology No results found.   Procedures Procedures (including critical care time)  Medications Ordered in UC Medications - No data to display  Initial Impression / Assessment and Plan / UC Course  I have reviewed the triage vital signs and the nursing notes.  Pertinent labs & imaging results that were available during my care of the patient were reviewed by me and considered in my medical decision making (see chart for details).       Pt is a 33 y.o. female who presents for 2 days of respiratory symptoms. Alicia [name] is afebrile here without recent antipyretics. Satting well on room air. Overall pt is non-toxic appearing, well hydrated, without respiratory distress. Pulmonary exam is unremarkable. Chest  imaging deferred.  POC COVID and influenza panel  obtained and was negative. POC strep is negative.   Suspect viral respiratory illness. Discussed symptomatic treatment.  Promethazine  DM for cough. Explained lack of efficacy of antibiotics in viral disease however patient was started on Augmentin  for possible bacterial sinusitis that was prescribed via an E-visit. She does have right otitis media therefore advised to continue taking augment. Typical duration of symptoms discussed.   Return and ED precautions given and voiced understanding. Discussed MDM, treatment plan and plan for follow-up with patient who agrees with plan.     Final Clinical Impressions(s) / UC Diagnoses   Final diagnoses:  URI with cough and congestion  Non-recurrent acute suppurative otitis media of right ear without spontaneous rupture of tympanic membrane     Discharge Instructions      Your COVID, infleunza and strep tests are negative.  Take the Augmentin  twice a day for 7 days, as previously prescribed. Stop by the pharmacy to pick up your prescriptions.  Follow up with your primary care provider or return to the urgent care, if not improving.       ED Prescriptions     Medication Sig Dispense Auth. Provider   promethazine -dextromethorphan (PROMETHAZINE -DM) 6.25-15 MG/5ML syrup Take 5 mLs by mouth 4 (four) times daily as needed. 118 mL Navreet Bolda, DO      PDMP not reviewed this encounter.   Tionna Gigante, DO 10/07/24 [phone]

## 2024-09-27 NOTE — ED Triage Notes (Signed)
 Pt c/o cough,sore throat & R ear pain x2 days. Had E visit yesterday. Was given augmentin  & nasal spray w/o relief. States exposed to bacterial PNA.

## 2024-10-19 ENCOUNTER — Other Ambulatory Visit (HOSPITAL_COMMUNITY): Payer: Self-pay

## 2024-10-19 DIAGNOSIS — Z01411 Encounter for gynecological examination (general) (routine) with abnormal findings: Secondary | ICD-10-CM | POA: Diagnosis not present

## 2024-10-19 DIAGNOSIS — Z114 Encounter for screening for human immunodeficiency virus [HIV]: Secondary | ICD-10-CM | POA: Diagnosis not present

## 2024-10-19 DIAGNOSIS — Z803 Family history of malignant neoplasm of breast: Secondary | ICD-10-CM | POA: Diagnosis not present

## 2024-10-19 DIAGNOSIS — Z01419 Encounter for gynecological examination (general) (routine) without abnormal findings: Secondary | ICD-10-CM | POA: Diagnosis not present

## 2024-10-19 DIAGNOSIS — Z124 Encounter for screening for malignant neoplasm of cervix: Secondary | ICD-10-CM | POA: Diagnosis not present

## 2024-10-19 DIAGNOSIS — N920 Excessive and frequent menstruation with regular cycle: Secondary | ICD-10-CM | POA: Diagnosis not present

## 2024-10-19 DIAGNOSIS — Z113 Encounter for screening for infections with a predominantly sexual mode of transmission: Secondary | ICD-10-CM | POA: Diagnosis not present

## 2024-10-19 DIAGNOSIS — Z3041 Encounter for surveillance of contraceptive pills: Secondary | ICD-10-CM | POA: Diagnosis not present

## 2024-10-19 DIAGNOSIS — Z8041 Family history of malignant neoplasm of ovary: Secondary | ICD-10-CM | POA: Diagnosis not present

## 2024-10-19 MED ORDER — CYRED EQ 0.15-30 MG-MCG PO TABS
1.0000 | ORAL_TABLET | Freq: Every day | ORAL | 1 refills | Status: AC
  Filled 2024-10-19: qty 84, 84d supply, fill #0
  Filled 2024-10-31: qty 28, 28d supply, fill #0

## 2024-10-20 ENCOUNTER — Other Ambulatory Visit (HOSPITAL_COMMUNITY): Payer: Self-pay

## 2024-10-31 ENCOUNTER — Other Ambulatory Visit (HOSPITAL_COMMUNITY): Payer: Self-pay

## 2024-11-03 ENCOUNTER — Other Ambulatory Visit (HOSPITAL_COMMUNITY): Payer: Self-pay

## 2024-11-03 ENCOUNTER — Telehealth: Admitting: Physician Assistant

## 2024-11-03 DIAGNOSIS — J101 Influenza due to other identified influenza virus with other respiratory manifestations: Secondary | ICD-10-CM

## 2024-11-03 MED ORDER — OSELTAMIVIR PHOSPHATE 75 MG PO CAPS: 75.0000 mg | ORAL_CAPSULE | Freq: Two times a day (BID) | ORAL | 0 refills | Status: AC

## 2024-11-03 NOTE — Progress Notes (Signed)
 E visit for Flu like symptoms   We are sorry that you are not feeling well.  Here is how we plan to help! Based on what you have shared with me it looks like you may have a respiratory virus that may be influenza.  Influenza or the flu is  an infection caused by a respiratory virus. The flu virus is highly contagious and persons who did not receive their yearly flu vaccination may catch the flu from close contact.  We have anti-viral medications to treat the viruses that cause this infection. They are not a cure and only shorten the course of the infection. These prescriptions are most effective when they are given within the first 2 days of flu symptoms. Antiviral medications are indicated if you have a high risk of complications from the flu. You should  also consider an antiviral medication if you are in close contact with someone who is at risk. These medications can help patients avoid complications from the flu but have side effects that you should know.   Possible side effects from Tamiflu  or oseltamivir  include nausea, vomiting, diarrhea, dizziness, headaches, eye redness, sleep problems or other respiratory symptoms. You should not take Tamiflu  if you have an allergy to oseltamivir  or any to the ingredients in Tamiflu .  Based upon your symptoms and potential risk factors I have prescribed Oseltamivir  (Tamiflu ).  It has been sent to your designated pharmacy.  You will take one 75 mg capsule orally twice a day for the next 5 days.   For nasal congestion, you may use an oral decongestant such as Mucinex D or if you have glaucoma or high blood pressure use plain Mucinex.  Saline nasal spray or nasal drops can help and can safely be used as often as needed for congestion.  If you have a sore or scratchy throat, use a saltwater gargle-  to  teaspoon of salt dissolved in a 4-ounce to 8-ounce glass of warm water.  Gargle the solution for approximately 15-30 seconds and then spit.  It is  important not to swallow the solution.  You can also use throat lozenges/cough drops and Chloraseptic spray to help with throat pain or discomfort.  Warm or cold liquids can also be helpful in relieving throat pain.  For headache, pain or general discomfort, you can use Ibuprofen  or Tylenol  as directed.   Some authorities believe that zinc sprays or the use of Echinacea may shorten the course of your symptoms.  You are to isolate at home until you have been fever-free for at least 24 hours without a fever-reducing medication, and symptoms have been steadily improving for 24 hours.  If you must be around other household members who do not have symptoms, you need to make sure that both you and the family members are masking consistently with a high-quality mask.  If you note any worsening of symptoms despite treatment, please seek an in-person evaluation ASAP. If you note any significant shortness of breath or any chest pain, please seek ED evaluation. Please do not delay care!  ANYONE WHO HAS FLU SYMPTOMS SHOULD: Stay home. The flu is highly contagious and going out or to work exposes others! Be sure to drink plenty of fluids. Water is fine as well as fruit juices, sodas and electrolyte beverages. You may want to stay away from caffeine or alcohol. If you are nauseated, try taking small sips of liquids. How do you know if you are getting enough fluid? Your urine should be a  pale yellow or almost colorless. Get rest. Taking a steamy shower or using a humidifier may help nasal congestion and ease sore throat pain. Using a saline nasal spray works much the same way. Cough drops, hard candies and sore throat lozenges may ease your cough. Line up a caregiver. Have someone check on you regularly.  GET HELP RIGHT AWAY IF: You cannot keep down liquids or your medications. You become short of breath Your fell like you are going to pass out or loose consciousness. Your symptoms persist after you have  completed your treatment plan  MAKE SURE YOU  Understand these instructions. Will watch your condition. Will get help right away if you are not doing well or get worse.  Your e-visit answers were reviewed by a board certified advanced clinical practitioner to complete your personal care plan.  Depending on the condition, your plan could have included both over the counter or prescription medications.  If there is a problem please reply  once you have received a response from your provider.  Your safety is important to us .  If you have drug allergies check your prescription carefully.    You can use MyChart to ask questions about todays visit, request a non-urgent call back, or ask for a work or school excuse for 24 hours related to this e-Visit. If it has been greater than 24 hours you will need to follow up with your provider, or enter a new e-Visit to address those concerns.  You will get an e-mail in the next two days asking about your experience.  I hope that your e-visit has been valuable and will speed your recovery. Thank you for using e-visits.   I have spent 5 minutes in review of e-visit questionnaire, review and updating patient chart, medical decision making and response to patient.   Delon CHRISTELLA Dickinson, PA-C
